# Patient Record
Sex: Female | Born: 1970 | Race: White | Hispanic: No | Marital: Married | Smoking: Never smoker
Health system: Southern US, Community
[De-identification: ages and names within clinical notes are randomized; demographics above are authoritative.]

## PROBLEM LIST (undated history)

## (undated) DIAGNOSIS — F329 Major depressive disorder, single episode, unspecified: Secondary | ICD-10-CM

## (undated) DIAGNOSIS — F419 Anxiety disorder, unspecified: Secondary | ICD-10-CM

## (undated) DIAGNOSIS — F32A Depression, unspecified: Secondary | ICD-10-CM

## (undated) DIAGNOSIS — J45909 Unspecified asthma, uncomplicated: Secondary | ICD-10-CM

## (undated) DIAGNOSIS — G4762 Sleep related leg cramps: Secondary | ICD-10-CM

## (undated) DIAGNOSIS — O2230 Deep phlebothrombosis in pregnancy, unspecified trimester: Secondary | ICD-10-CM

## (undated) DIAGNOSIS — I2699 Other pulmonary embolism without acute cor pulmonale: Secondary | ICD-10-CM

## (undated) DIAGNOSIS — R002 Palpitations: Secondary | ICD-10-CM

## (undated) HISTORY — DX: Palpitations: R00.2

## (undated) HISTORY — PX: ABDOMINAL HYSTERECTOMY: SHX81

## (undated) HISTORY — DX: Sleep related leg cramps: G47.62

## (undated) HISTORY — PX: TUBAL LIGATION: SHX77

---
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
## 1998-06-26 ENCOUNTER — Other Ambulatory Visit: Admission: RE | Admit: 1998-06-26 | Discharge: 1998-06-26 | Payer: Self-pay | Admitting: *Deleted

## 1999-01-13 ENCOUNTER — Ambulatory Visit (HOSPITAL_COMMUNITY): Admission: RE | Admit: 1999-01-13 | Discharge: 1999-01-13 | Payer: Self-pay | Admitting: Gastroenterology

## 2000-01-13 ENCOUNTER — Other Ambulatory Visit: Admission: RE | Admit: 2000-01-13 | Discharge: 2000-01-13 | Payer: Self-pay | Admitting: *Deleted

## 2000-06-07 ENCOUNTER — Ambulatory Visit (HOSPITAL_COMMUNITY): Admission: RE | Admit: 2000-06-07 | Discharge: 2000-06-07 | Payer: Self-pay | Admitting: Gastroenterology

## 2001-10-16 ENCOUNTER — Other Ambulatory Visit: Admission: RE | Admit: 2001-10-16 | Discharge: 2001-10-16 | Payer: Self-pay | Admitting: *Deleted

## 2002-02-21 ENCOUNTER — Other Ambulatory Visit: Admission: RE | Admit: 2002-02-21 | Discharge: 2002-02-21 | Payer: Self-pay | Admitting: *Deleted

## 2003-05-27 ENCOUNTER — Other Ambulatory Visit: Admission: RE | Admit: 2003-05-27 | Discharge: 2003-05-27 | Payer: Self-pay | Admitting: *Deleted

## 2005-03-03 ENCOUNTER — Other Ambulatory Visit: Admission: RE | Admit: 2005-03-03 | Discharge: 2005-03-03 | Payer: Self-pay | Admitting: Obstetrics and Gynecology

## 2005-04-30 ENCOUNTER — Ambulatory Visit: Payer: Self-pay | Admitting: Internal Medicine

## 2005-07-16 ENCOUNTER — Ambulatory Visit: Payer: Self-pay | Admitting: Internal Medicine

## 2005-07-23 ENCOUNTER — Ambulatory Visit: Payer: Self-pay | Admitting: Internal Medicine

## 2005-07-26 ENCOUNTER — Ambulatory Visit: Payer: Self-pay | Admitting: Internal Medicine

## 2005-07-30 ENCOUNTER — Ambulatory Visit: Payer: Self-pay | Admitting: Internal Medicine

## 2005-08-05 ENCOUNTER — Ambulatory Visit: Payer: Self-pay | Admitting: Internal Medicine

## 2005-08-13 ENCOUNTER — Ambulatory Visit: Payer: Self-pay | Admitting: Internal Medicine

## 2005-08-27 ENCOUNTER — Ambulatory Visit: Payer: Self-pay | Admitting: Internal Medicine

## 2005-09-06 ENCOUNTER — Ambulatory Visit: Payer: Self-pay | Admitting: Internal Medicine

## 2005-09-13 ENCOUNTER — Ambulatory Visit: Payer: Self-pay | Admitting: Internal Medicine

## 2005-09-28 ENCOUNTER — Ambulatory Visit: Payer: Self-pay | Admitting: Internal Medicine

## 2005-10-26 ENCOUNTER — Ambulatory Visit: Payer: Self-pay | Admitting: Internal Medicine

## 2005-11-29 ENCOUNTER — Ambulatory Visit: Payer: Self-pay | Admitting: Internal Medicine

## 2006-05-03 DIAGNOSIS — I2699 Other pulmonary embolism without acute cor pulmonale: Secondary | ICD-10-CM

## 2006-05-03 HISTORY — DX: Other pulmonary embolism without acute cor pulmonale: I26.99

## 2006-05-11 DIAGNOSIS — I2699 Other pulmonary embolism without acute cor pulmonale: Secondary | ICD-10-CM

## 2006-09-30 ENCOUNTER — Ambulatory Visit (HOSPITAL_COMMUNITY): Admission: RE | Admit: 2006-09-30 | Discharge: 2006-09-30 | Payer: Self-pay | Admitting: Obstetrics and Gynecology

## 2006-09-30 ENCOUNTER — Encounter (HOSPITAL_COMMUNITY): Payer: Self-pay | Admitting: Obstetrics and Gynecology

## 2007-06-06 ENCOUNTER — Telehealth (INDEPENDENT_AMBULATORY_CARE_PROVIDER_SITE_OTHER): Payer: Self-pay | Admitting: *Deleted

## 2008-04-19 ENCOUNTER — Ambulatory Visit: Payer: Self-pay | Admitting: Family Medicine

## 2008-04-19 DIAGNOSIS — Z86718 Personal history of other venous thrombosis and embolism: Secondary | ICD-10-CM | POA: Insufficient documentation

## 2008-04-19 DIAGNOSIS — R42 Dizziness and giddiness: Secondary | ICD-10-CM

## 2008-04-19 DIAGNOSIS — R252 Cramp and spasm: Secondary | ICD-10-CM | POA: Insufficient documentation

## 2008-04-22 ENCOUNTER — Encounter (INDEPENDENT_AMBULATORY_CARE_PROVIDER_SITE_OTHER): Payer: Self-pay | Admitting: *Deleted

## 2008-04-22 LAB — CONVERTED CEMR LAB
BUN: 12 mg/dL (ref 6–23)
Basophils Absolute: 0 10*3/uL (ref 0.0–0.1)
CO2: 26 meq/L (ref 19–32)
Calcium: 9.4 mg/dL (ref 8.4–10.5)
Chloride: 106 meq/L (ref 96–112)
Creatinine, Ser: 0.5 mg/dL (ref 0.4–1.2)
GFR calc Af Amer: 177 mL/min
GFR calc non Af Amer: 146 mL/min
Glucose, Bld: 74 mg/dL (ref 70–99)
MCHC: 34.1 g/dL (ref 30.0–36.0)
MCV: 89.6 fL (ref 78.0–100.0)
Neutrophils Relative %: 50.3 % (ref 43.0–77.0)
Platelets: 237 10*3/uL (ref 150–400)
Potassium: 3.9 meq/L (ref 3.5–5.1)
RDW: 12.2 % (ref 11.5–14.6)
TSH: 1.68 microintl units/mL (ref 0.35–5.50)

## 2008-05-17 ENCOUNTER — Encounter: Payer: Self-pay | Admitting: Family Medicine

## 2008-05-31 ENCOUNTER — Encounter: Payer: Self-pay | Admitting: Family Medicine

## 2008-11-21 ENCOUNTER — Ambulatory Visit: Payer: Self-pay | Admitting: Internal Medicine

## 2008-11-21 DIAGNOSIS — T148XXA Other injury of unspecified body region, initial encounter: Secondary | ICD-10-CM | POA: Insufficient documentation

## 2009-01-03 ENCOUNTER — Encounter: Admission: RE | Admit: 2009-01-03 | Discharge: 2009-01-03 | Payer: Self-pay | Admitting: Obstetrics and Gynecology

## 2010-05-24 ENCOUNTER — Encounter: Payer: Self-pay | Admitting: Internal Medicine

## 2010-09-07 ENCOUNTER — Other Ambulatory Visit: Payer: Self-pay | Admitting: Surgery

## 2010-09-18 NOTE — H&P (Signed)
NAMEBROOKLEN, Diane Shaffer            ACCOUNT NO.:  [id number]   MEDICAL RECORD NO.:  [id number]          PATIENT TYPE:  AMB   LOCATION:  SDC                           FACILITY:  [facility]   PHYSICIAN:  Zelphia Cairo, MD    DATE OF BIRTH:  1971/02/10   DATE OF ADMISSION:  DATE OF DISCHARGE:                              HISTORY & PHYSICAL   HISTORY OF PRESENT ILLNESS:  The patient is a 40 year old white female  who presented in December with complaints of heavy menstrual cycles.  Her past medical history is significant for a right-sided pulmonary  embolus diagnosed in March.  Therefore, she is unable to be on  combination contraceptive pills.  Since being off birth control pills,  she has noticed heavier, longer menstrual cycles.   PAST MEDICAL HISTORY:  1. Migraines.  2. Pulmonary embolus.   PAST SURGICAL HISTORY:  Negative.   SOCIAL HISTORY:  Negative for tobacco use.   FAMILY HISTORY:  Significant for a sister with anemia and a mother with  asthma.   ALLERGIES:  None.   MEDICATIONS:  None.   GYNECOLOGIC HISTORY:  Significant for abnormal Pap smears in the past.  She is status post a LEEP in July 2003.  Pap smears have since been  normal.   PHYSICAL EXAMINATION:  VITAL SIGNS:  Height 5 feet, 10 inches, weight  172, blood pressure 116/76, hemoglobin 15, urine negative.  HEAD/NECK:  Normal.  No thyromegaly or nodularity.  HEART:  Regular rate and rhythm.  LUNGS:  Clear bilaterally.  ABDOMEN:  Soft, nontender and nondistended.  No masses palpated.  PELVIC:  Normal external female genitalia and urethral meatus.  Vagina  and cervix are normal and without lesions.  Uterus is mobile and  nontender.  No adnexal masses noted.   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  There were no adnexal masses or free fluid noted.  After  saline infusion, a 13-mm polypoid mass was seen within the  endometrial  cavity at the fundus.   ASSESSMENT/PLAN:  A 40 year old white female with endometrial polyp and  heavy long menstrual cycles.  We discussed options, including  hysteroscopy D&C with resection of polyp.  The patient agrees.  Informed  consent was obtained.      Zelphia Cairo, MD  Electronically Signed     GA/MEDQ  D:  09/29/2006  T:  09/29/2006  Job:  [id number]

## 2010-09-18 NOTE — Op Note (Signed)
Diane Shaffer, LODWICK            ACCOUNT NO.:  [id number]   MEDICAL RECORD NO.:  [id number]          PATIENT TYPE:  AMB   LOCATION:  SDC                           [facility]   PHYSICIAN:  Zelphia Cairo, MD    DATE OF BIRTH:  1970/06/05   DATE OF PROCEDURE:  09/30/2006  DATE OF DISCHARGE:                               OPERATIVE REPORT   PREOPERATIVE DIAGNOSES:  1. Heavy menses.  2. Suspect endometrial polyp.   POSTOPERATIVE DIAGNOSIS:  1. Heavy menses.  2. Suspect endometrial polyp.   PROCEDURE:  Hysteroscopy, dilatation and curettage.   SURGEON:  Zelphia Cairo, MD   ESTIMATED BLOOD LOSS:  None.   ANESTHESIA:  General.   FLUID DEFICIT:  Zero.   COMPLICATIONS:  None.   SPECIMEN:  Endometrial curettings.   PROCEDURE:  The patient was taken to the operating room, where pneumatic  compression device hose were placed on bilateral lower extremities and  anesthesia was adequately obtained.  She was placed in the dorsal  lithotomy position using Allen stirrups.  She was prepped and draped in  sterile fashion and an in-and-out catheter was used to drain her bladder  for approximately 150 mL of clear urine.  A bivalve speculum was placed  in the vagina and a single-tooth tenaculum was used to grasp the  anterior lip of the cervix.  Lidocaine 1% was used to provide a cervical  block.  The cervix was patulous and easily dilated.  The hysteroscope  was then inserted and the endometrial cavity was inspected.  Bilateral  ostia were visualized and appeared normal.  There was some small fluffy  polypoid and endometrial-type tissue noted in the posterior uterus near  the internal os.  Otherwise the  endometrial cavity appeared normal.  The hysteroscope was removed and an  endometrial curetting was performed.  The specimen was placed on Telfa  and sent off to pathology.  The speculum and tenaculum were removed from  the vagina and the patient was taken to the recovery room in  stable  condition.      Zelphia Cairo, MD  Electronically Signed     GA/MEDQ  D:  09/30/2006  T:  09/30/2006  Job:  [id number]

## 2011-11-30 ENCOUNTER — Ambulatory Visit (INDEPENDENT_AMBULATORY_CARE_PROVIDER_SITE_OTHER): Payer: BC Managed Care – PPO | Admitting: Family Medicine

## 2011-11-30 ENCOUNTER — Encounter: Payer: Self-pay | Admitting: Family Medicine

## 2011-11-30 VITALS — BP 110/74 | HR 68 | Temp 98.5°F | Ht 68.5 in | Wt 183.8 lb

## 2011-11-30 DIAGNOSIS — R259 Unspecified abnormal involuntary movements: Secondary | ICD-10-CM

## 2011-11-30 DIAGNOSIS — Z0000 Encounter for general adult medical examination without abnormal findings: Secondary | ICD-10-CM

## 2011-11-30 DIAGNOSIS — R251 Tremor, unspecified: Secondary | ICD-10-CM

## 2011-11-30 DIAGNOSIS — R6889 Other general symptoms and signs: Secondary | ICD-10-CM

## 2011-11-30 LAB — BASIC METABOLIC PANEL
BUN: 12 mg/dL (ref 6–23)
Calcium: 9 mg/dL (ref 8.4–10.5)
Creatinine, Ser: 0.9 mg/dL (ref 0.4–1.2)
GFR: 75.38 mL/min (ref >60.00)
Glucose, Bld: 90 mg/dL (ref 70–99)

## 2011-11-30 LAB — HEPATIC FUNCTION PANEL
ALT: 16 U/L (ref 0–35)
AST: 19 U/L (ref 0–37)
Albumin: 3.9 g/dL (ref 3.5–5.2)
Alkaline Phosphatase: 41 U/L (ref 39–117)
Bilirubin, Direct: 0.1 mg/dL (ref 0.0–0.3)
Total Bilirubin: 0.6 mg/dL (ref 0.3–1.2)
Total Protein: 7.3 g/dL (ref 6.0–8.3)

## 2011-11-30 LAB — CBC WITH DIFFERENTIAL/PLATELET
Basophils Relative: 0.6 % (ref 0.0–3.0)
Eosinophils Relative: 3.9 % (ref 0.0–5.0)
HCT: 39.6 % (ref 36.0–46.0)
Lymphs Abs: 1.8 10*3/uL (ref 0.7–4.0)
MCV: 92.1 fl (ref 78.0–100.0)
Monocytes Absolute: 0.5 10*3/uL (ref 0.1–1.0)
Neutro Abs: 3.3 10*3/uL (ref 1.4–7.7)
Platelets: 246 10*3/uL (ref 150.0–400.0)
RBC: 4.3 Mil/uL (ref 3.87–5.11)
WBC: 6 10*3/uL (ref 4.5–10.5)

## 2011-11-30 LAB — LIPID PANEL
HDL: 57 mg/dL (ref >39.00)
Triglycerides: 78 mg/dL (ref 0.0–149.0)

## 2011-11-30 LAB — TSH: TSH: 0.83 u[IU]/mL (ref 0.35–5.50)

## 2011-11-30 NOTE — Assessment & Plan Note (Signed)
Pt's PE WNL.  UTD on GYN.  Check labs.  EKG done- see document for interpretation.  Anticipatory guidance provided.  

## 2011-11-30 NOTE — Patient Instructions (Addendum)
Follow up in 4-6 weeks to recheck the tremor Keep a log of your symptoms- when they occur, how long they last, any relation to stress/food/etc, what makes it better/worse, etc We'll notify you of your lab results Call with any questions or concerns Hang in there!

## 2011-11-30 NOTE — Progress Notes (Signed)
  Subjective:    Patient ID: Diane Shaffer, female    DOB: Aug 11, 1970, 41 y.o.   MRN: [id number]  HPI Pt desires CPE.  Concerns- bruising 'all the time', cold intolerance, and 'random shaking' of hands.  Occasionally will have shaking of legs.  sxs started w/in the last year.  At times will have associated weakness of arm.  No relation to eating.  OB-GYN felt sxs may be stress related and started her on Lexapro 1 week ago.  Shaking will resolve spontaneously.  Not able to comment on duration.   Review of Systems Patient reports no vision/ hearing changes, adenopathy,fever, weight change,  persistant/recurrent hoarseness , swallowing issues, chest pain, palpitations, edema, persistant/recurrent cough, hemoptysis, dyspnea (rest/exertional/paroxysmal nocturnal), gastrointestinal bleeding (melena, rectal bleeding), abdominal pain, significant heartburn, bowel changes, GU symptoms (dysuria, hematuria, incontinence), Gyn symptoms (abnormal  bleeding, pain),  syncope, memory loss, numbness & tingling, skin/hair/nail changes..     Objective:   Physical Exam General Appearance:    Alert, cooperative, no distress, appears stated age  Head:    Normocephalic, without obvious abnormality, atraumatic  Eyes:    PERRL, conjunctiva/corneas clear, EOM's intact, fundi    benign, both eyes  Ears:    Normal TM's and external ear canals, both ears  Nose:   Nares normal, septum midline, mucosa normal, no drainage    or sinus tenderness  Throat:   Lips, mucosa, and tongue normal; teeth and gums normal  Neck:   Supple, symmetrical, trachea midline, no adenopathy;    Thyroid: no enlargement/tenderness/nodules  Back:     Symmetric, no curvature, ROM normal, no CVA tenderness  Lungs:     Clear to auscultation bilaterally, respirations unlabored  Chest Wall:    No tenderness or deformity   Heart:    Regular rate and rhythm, S1 and S2 normal, no murmur, rub   or gallop  Breast Exam:    Deferred to GYN  Abdomen:      Soft, non-tender, bowel sounds active all four quadrants,    no masses, no organomegaly  Genitalia:    Deferred to GYN  Rectal:    Extremities:   Extremities normal, atraumatic, no cyanosis or edema  Pulses:   2+ and symmetric all extremities  Skin:   Skin color, texture, turgor normal, no rashes or lesions  Lymph nodes:   Cervical, supraclavicular, and axillary nodes normal  Neurologic:   CNII-XII intact, normal strength, sensation and reflexes    Throughout.  No tremor noted.  No cogwheeling.          Assessment & Plan:

## 2011-11-30 NOTE — Assessment & Plan Note (Signed)
New.  No tremor seen today.  May be anxiety related.  Pt recently started on Lexapro.  Encouraged her to chart sxs.  Check labs to determine possible metabolic cause.  Reviewed supportive care and red flags that should prompt return.  Pt expressed understanding and is in agreement w/ plan.

## 2011-11-30 NOTE — Assessment & Plan Note (Signed)
New.  Check CBC to r/o anemia, TSH to r/o hypothyroid.

## 2011-12-01 ENCOUNTER — Encounter: Payer: Self-pay | Admitting: *Deleted

## 2011-12-02 LAB — VITAMIN D 1,25 DIHYDROXY
Vitamin D 1, 25 (OH)2 Total: 73 pg/mL — ABNORMAL HIGH (ref 18–72)
Vitamin D2 1, 25 (OH)2: <8 pg/mL

## 2011-12-03 ENCOUNTER — Encounter: Payer: Self-pay | Admitting: *Deleted

## 2011-12-30 ENCOUNTER — Encounter: Payer: Self-pay | Admitting: Family Medicine

## 2012-03-02 ENCOUNTER — Encounter (HOSPITAL_COMMUNITY): Payer: Self-pay | Admitting: Pharmacist

## 2012-03-06 ENCOUNTER — Encounter (HOSPITAL_COMMUNITY)
Admission: RE | Admit: 2012-03-06 | Discharge: 2012-03-06 | Disposition: A | Discharge status: Home / Self Care | Payer: BC Managed Care – PPO | Source: Ambulatory Visit | Attending: Obstetrics and Gynecology | Admitting: Obstetrics and Gynecology

## 2012-03-06 ENCOUNTER — Encounter (HOSPITAL_COMMUNITY): Payer: Self-pay

## 2012-03-06 HISTORY — DX: Other pulmonary embolism without acute cor pulmonale: I26.99

## 2012-03-06 HISTORY — DX: Depression, unspecified: F32.A

## 2012-03-06 HISTORY — DX: Major depressive disorder, single episode, unspecified: F32.9

## 2012-03-06 HISTORY — DX: Anxiety disorder, unspecified: F41.9

## 2012-03-06 LAB — CBC
Hemoglobin: 13.6 g/dL (ref 12.0–15.0)
MCH: 31.1 pg (ref 26.0–34.0)
Platelets: 213 10*3/uL (ref 150–400)
RBC: 4.37 MIL/uL (ref 3.87–5.11)
WBC: 10.4 10*3/uL (ref 4.0–10.5)

## 2012-03-06 LAB — SURGICAL PCR SCREEN: MRSA, PCR: NEGATIVE

## 2012-03-06 NOTE — Patient Instructions (Addendum)
20 Diane Shaffer  03/06/2012   Your procedure is scheduled on:  03/16/12  Enter through the Main Entrance of [facility] at 6 AM.  Pick up the phone at the desk and dial [phone].   Call this number if you have problems the morning of surgery: [phone]   Remember:   Do not eat food:After Midnight.  Do not drink clear liquids: After Midnight.  Take these medicines the morning of surgery with A SIP OF WATER: NA   Do not wear jewelry, make-up or nail polish.  Do not wear lotions, powders, or perfumes. You may wear deodorant.  Do not shave 48 hours prior to surgery.  Do not bring valuables to the hospital.  Contacts, dentures or bridgework may not be worn into surgery.  Leave suitcase in the car. After surgery it may be brought to your room.  For patients admitted to the hospital, checkout time is 11:00 AM the day of discharge.   Patients discharged the day of surgery will not be allowed to drive home.  Name and phone number of your driver: NA  Special Instructions: Shower using CHG 2 nights before surgery and the night before surgery.  If you shower the day of surgery use CHG.  Use special wash - you have one bottle of CHG for all showers.  You should use approximately 1/3 of the bottle for each shower.   Please read over the following fact sheets that you were given: MRSA Information

## 2012-03-07 NOTE — H&P (Signed)
[name], [name] NO.:  [id number]  MEDICAL RECORD NO.:  [id number]  LOCATION:  SDC                           FACILITY:  [facility]  PHYSICIAN:  Zelphia Cairo, MD    DATE OF BIRTH:  [birth year]  DATE OF ADMISSION:  03/06/2012 DATE OF DISCHARGE:  03/06/2012                             HISTORY & PHYSICAL   HISTORY OF PRESENT ILLNESS:  A 41 year old, G2, P2, presents today for surgical management of menorrhagia.  She has failed medical management and presents today for definitive treatment.  PAST MEDICAL HISTORY: 1. History of pulmonary embolus. 2. History of asthma. 3. Depression and anxiety.  MEDICATIONS: 1. Lexapro. 2. Depo-Provera.  SURGICAL HISTORY:  D and C, and tubal ligation.  SOCIAL HISTORY:  Negative for tobacco, positive for alcohol 1 drink per day.  ALLERGIES:  MORPHINE (has tolerated Percocet in the past).  FAMILY HISTORY:  Heart disease, thyroid disease, respiratory disease, arthritis, and diabetes.  PHYSICAL EXAMINATION:  VITAL SIGNS:  Height 5 feet and 8 inches, weight 190, blood pressure 110/70. GENERA:  She is in no acute distress. HEAD AND NECK:  Normal.  No thyromegaly. HEART:  Regular rate and rhythm. LUNGS:  Clear bilaterally. ABDOMEN:  Soft, nontender, nondistended. PELVIC:  Shows normal external female genitalia, and urethral meatus, vagina and cervix are normal.  No lesions.  Uterus is mobile and nontender.  No adnexal masses.  ASSESSMENT:  Menorrhagia.  PLAN:  Laparoscopic-assisted vaginal hysterectomy.     Zelphia Cairo, MD     GA/MEDQ  D:  03/06/2012  T:  03/07/2012  Job:  [id number]

## 2012-03-16 ENCOUNTER — Encounter (HOSPITAL_COMMUNITY)
Admission: RE | Disposition: A | Discharge status: Home / Self Care | Payer: Self-pay | Source: Ambulatory Visit | Attending: Obstetrics and Gynecology

## 2012-03-16 ENCOUNTER — Encounter (HOSPITAL_COMMUNITY): Payer: Self-pay | Admitting: *Deleted

## 2012-03-16 ENCOUNTER — Observation Stay (HOSPITAL_COMMUNITY)
Admission: RE | Admit: 2012-03-16 | Discharge: 2012-03-17 | Disposition: A | Discharge status: Home / Self Care | Payer: BC Managed Care – PPO | Source: Ambulatory Visit | Attending: Obstetrics and Gynecology | Admitting: Obstetrics and Gynecology

## 2012-03-16 ENCOUNTER — Encounter (HOSPITAL_COMMUNITY): Payer: Self-pay | Admitting: Anesthesiology

## 2012-03-16 ENCOUNTER — Ambulatory Visit (HOSPITAL_COMMUNITY): Payer: BC Managed Care – PPO | Admitting: Anesthesiology

## 2012-03-16 DIAGNOSIS — N946 Dysmenorrhea, unspecified: Secondary | ICD-10-CM | POA: Insufficient documentation

## 2012-03-16 DIAGNOSIS — N920 Excessive and frequent menstruation with regular cycle: Principal | ICD-10-CM | POA: Insufficient documentation

## 2012-03-16 HISTORY — PX: LAPAROSCOPIC ASSISTED VAGINAL HYSTERECTOMY: SHX5398

## 2012-03-16 LAB — TYPE AND SCREEN: ABO/RH(D): B POS

## 2012-03-16 SURGERY — HYSTERECTOMY, VAGINAL, LAPAROSCOPY-ASSISTED
Anesthesia: General | Site: Abdomen | Wound class: Clean Contaminated

## 2012-03-16 MED ORDER — ONDANSETRON HCL 4 MG/2ML IJ SOLN: 4.0000 mg | Freq: Four times a day (QID) | INTRAMUSCULAR | Status: DC | PRN

## 2012-03-16 MED ORDER — MENTHOL 3 MG MT LOZG: 1.0000 | LOZENGE | OROMUCOSAL | Status: DC | PRN

## 2012-03-16 MED ORDER — PROMETHAZINE HCL 25 MG/ML IJ SOLN: 6.2500 mg | INTRAMUSCULAR | Status: DC | PRN

## 2012-03-16 MED ORDER — ONDANSETRON HCL 4 MG/2ML IJ SOLN
INTRAMUSCULAR | Status: AC
  Filled 2012-03-16: qty 2

## 2012-03-16 MED ORDER — ONDANSETRON HCL 4 MG PO TABS: 4.0000 mg | ORAL_TABLET | Freq: Four times a day (QID) | ORAL | Status: DC | PRN

## 2012-03-16 MED ORDER — OXYCODONE-ACETAMINOPHEN 5-325 MG PO TABS
1.0000 | ORAL_TABLET | ORAL | Status: DC | PRN
Start: 2012-03-16 — End: 2012-03-17
  Administered 2012-03-16: 2 via ORAL
  Filled 2012-03-16: qty 2

## 2012-03-16 MED ORDER — HYDROMORPHONE HCL PF 1 MG/ML IJ SOLN
1.0000 mg | INTRAMUSCULAR | Status: DC | PRN
  Administered 2012-03-16 – 2012-03-17 (×5): 1 mg via INTRAVENOUS
  Filled 2012-03-16 (×5): qty 1

## 2012-03-16 MED ORDER — LACTATED RINGERS IV SOLN
INTRAVENOUS | Status: DC
  Administered 2012-03-16 (×2): via INTRAVENOUS
  Administered 2012-03-16: 125 mL/h via INTRAVENOUS

## 2012-03-16 MED ORDER — FENTANYL CITRATE 0.05 MG/ML IJ SOLN
25.0000 ug | INTRAMUSCULAR | Status: DC | PRN
  Administered 2012-03-16: 50 ug via INTRAVENOUS

## 2012-03-16 MED ORDER — HYDROMORPHONE HCL PF 1 MG/ML IJ SOLN
INTRAMUSCULAR | Status: DC | PRN
  Administered 2012-03-16: 1 mg via INTRAVENOUS

## 2012-03-16 MED ORDER — BUPIVACAINE HCL (PF) 0.25 % IJ SOLN
INTRAMUSCULAR | Status: AC
  Filled 2012-03-16: qty 30

## 2012-03-16 MED ORDER — EPHEDRINE SULFATE 50 MG/ML IJ SOLN
INTRAMUSCULAR | Status: DC | PRN
  Administered 2012-03-16: 10 mg via INTRAVENOUS

## 2012-03-16 MED ORDER — LIDOCAINE HCL (CARDIAC) 20 MG/ML IV SOLN
INTRAVENOUS | Status: DC | PRN
  Administered 2012-03-16: 50 mg via INTRAVENOUS

## 2012-03-16 MED ORDER — MIDAZOLAM HCL 5 MG/5ML IJ SOLN
INTRAMUSCULAR | Status: DC | PRN
  Administered 2012-03-16: 2 mg via INTRAVENOUS

## 2012-03-16 MED ORDER — HYDROMORPHONE HCL PF 1 MG/ML IJ SOLN
INTRAMUSCULAR | Status: AC
  Filled 2012-03-16: qty 1

## 2012-03-16 MED ORDER — ROCURONIUM BROMIDE 100 MG/10ML IV SOLN
INTRAVENOUS | Status: DC | PRN
  Administered 2012-03-16: 50 mg via INTRAVENOUS

## 2012-03-16 MED ORDER — ONDANSETRON HCL 4 MG/2ML IJ SOLN
INTRAMUSCULAR | Status: DC | PRN
  Administered 2012-03-16: 4 mg via INTRAVENOUS

## 2012-03-16 MED ORDER — PROPOFOL 10 MG/ML IV EMUL
INTRAVENOUS | Status: DC | PRN
  Administered 2012-03-16: 200 mg via INTRAVENOUS

## 2012-03-16 MED ORDER — PROPOFOL 10 MG/ML IV EMUL
INTRAVENOUS | Status: AC
  Filled 2012-03-16: qty 20

## 2012-03-16 MED ORDER — DEXTROSE 5 % IV SOLN
2.0000 g | INTRAVENOUS | Status: AC
  Administered 2012-03-16: 2 g via INTRAVENOUS
  Filled 2012-03-16: qty 2

## 2012-03-16 MED ORDER — IBUPROFEN 600 MG PO TABS
600.0000 mg | ORAL_TABLET | Freq: Four times a day (QID) | ORAL | Status: DC | PRN
  Administered 2012-03-17 (×2): 600 mg via ORAL
  Filled 2012-03-16 (×2): qty 1

## 2012-03-16 MED ORDER — NEOSTIGMINE METHYLSULFATE 1 MG/ML IJ SOLN
INTRAMUSCULAR | Status: DC | PRN
  Administered 2012-03-16: 2 mg via INTRAVENOUS

## 2012-03-16 MED ORDER — MEPERIDINE HCL 25 MG/ML IJ SOLN: 6.2500 mg | INTRAMUSCULAR | Status: DC | PRN

## 2012-03-16 MED ORDER — FENTANYL CITRATE 0.05 MG/ML IJ SOLN
INTRAMUSCULAR | Status: AC
  Filled 2012-03-16: qty 2

## 2012-03-16 MED ORDER — KETOROLAC TROMETHAMINE 30 MG/ML IJ SOLN: 15.0000 mg | Freq: Once | INTRAMUSCULAR | Status: DC | PRN

## 2012-03-16 MED ORDER — FENTANYL CITRATE 0.05 MG/ML IJ SOLN
INTRAMUSCULAR | Status: AC
  Filled 2012-03-16: qty 5

## 2012-03-16 MED ORDER — DEXTROSE IN LACTATED RINGERS 5 % IV SOLN
INTRAVENOUS | Status: DC
  Administered 2012-03-16 (×2): via INTRAVENOUS

## 2012-03-16 MED ORDER — ROCURONIUM BROMIDE 50 MG/5ML IV SOLN
INTRAVENOUS | Status: AC
  Filled 2012-03-16: qty 1

## 2012-03-16 MED ORDER — FENTANYL CITRATE 0.05 MG/ML IJ SOLN
INTRAMUSCULAR | Status: DC | PRN
  Administered 2012-03-16: 100 ug via INTRAVENOUS

## 2012-03-16 MED ORDER — BUPIVACAINE HCL (PF) 0.25 % IJ SOLN
INTRAMUSCULAR | Status: DC | PRN
  Administered 2012-03-16: 10 mL

## 2012-03-16 MED ORDER — INFLUENZA VIRUS VACC SPLIT PF IM SUSP
0.5000 mL | INTRAMUSCULAR | Status: AC
  Administered 2012-03-17: 0.5 mL via INTRAMUSCULAR

## 2012-03-16 MED ORDER — MIDAZOLAM HCL 2 MG/2ML IJ SOLN: 0.5000 mg | Freq: Once | INTRAMUSCULAR | Status: DC | PRN

## 2012-03-16 MED ORDER — MIDAZOLAM HCL 2 MG/2ML IJ SOLN
INTRAMUSCULAR | Status: AC
  Filled 2012-03-16: qty 2

## 2012-03-16 MED ORDER — ESCITALOPRAM OXALATE 10 MG PO TABS
10.0000 mg | ORAL_TABLET | Freq: Every day | ORAL | Status: DC
  Administered 2012-03-17: 10 mg via ORAL
  Filled 2012-03-16 (×3): qty 1

## 2012-03-16 MED ORDER — GLYCOPYRROLATE 0.2 MG/ML IJ SOLN
INTRAMUSCULAR | Status: DC | PRN
  Administered 2012-03-16: 0.2 mg via INTRAVENOUS
  Administered 2012-03-16: 0.4 mg via INTRAVENOUS

## 2012-03-16 MED ORDER — LIDOCAINE HCL (CARDIAC) 20 MG/ML IV SOLN
INTRAVENOUS | Status: AC
  Filled 2012-03-16: qty 5

## 2012-03-16 SURGICAL SUPPLY — 39 items
ADH SKN CLS APL DERMABOND .7 (GAUZE/BANDAGES/DRESSINGS) ×1
CANISTER SUCTION 2500CC (MISCELLANEOUS) ×2 IMPLANT
CHLORAPREP W/TINT 26ML (MISCELLANEOUS) ×2 IMPLANT
CLOTH BEACON ORANGE TIMEOUT ST (SAFETY) ×2 IMPLANT
COVER TABLE BACK 60X90 (DRAPES) ×2 IMPLANT
DERMABOND ADVANCED (GAUZE/BANDAGES/DRESSINGS) ×1
DERMABOND ADVANCED .7 DNX12 (GAUZE/BANDAGES/DRESSINGS) ×1 IMPLANT
ELECT REM PT RETURN 9FT ADLT (ELECTROSURGICAL) ×2
ELECTRODE REM PT RTRN 9FT ADLT (ELECTROSURGICAL) ×1 IMPLANT
GLOVE BIO SURGEON STRL SZ 6.5 (GLOVE) ×4 IMPLANT
GLOVE BIO SURGEON STRL SZ8 (GLOVE) ×2 IMPLANT
GLOVE BIOGEL PI IND STRL 6.5 (GLOVE) ×1 IMPLANT
GLOVE BIOGEL PI IND STRL 7.0 (GLOVE) ×2 IMPLANT
GLOVE BIOGEL PI INDICATOR 6.5 (GLOVE) ×1
GLOVE BIOGEL PI INDICATOR 7.0 (GLOVE) ×2
GLOVE ECLIPSE 6.5 STRL STRAW (GLOVE) ×2 IMPLANT
GLOVE INDICATOR 6.5 STRL GRN (GLOVE) ×2 IMPLANT
GLOVE INDICATOR 7.0 STRL GRN (GLOVE) ×2 IMPLANT
GOWN PREVENTION PLUS LG XLONG (DISPOSABLE) ×6 IMPLANT
GOWN STRL REIN XL XLG (GOWN DISPOSABLE) ×4 IMPLANT
NS IRRIG 1000ML POUR BTL (IV SOLUTION) ×2 IMPLANT
PACK LAVH (CUSTOM PROCEDURE TRAY) ×2 IMPLANT
PROTECTOR NERVE ULNAR (MISCELLANEOUS) ×4 IMPLANT
SEALER TISSUE G2 CVD JAW 45CM (ENDOMECHANICALS) ×2 IMPLANT
SPONGE GAUZE 2X2 8PLY STRL LF (GAUZE/BANDAGES/DRESSINGS) ×2 IMPLANT
SUT MNCRL 0 MO-4 VIOLET 18 CR (SUTURE) ×3 IMPLANT
SUT MON AB 2-0 CT1 36 (SUTURE) ×2 IMPLANT
SUT MONOCRYL 0 MO 4 18  CR/8 (SUTURE) ×3
SUT VIC AB 3-0 PS2 18 (SUTURE) ×2
SUT VIC AB 3-0 PS2 18XBRD (SUTURE) ×1 IMPLANT
SUT VICRYL 0 TIES 12 18 (SUTURE) ×2 IMPLANT
SUT VICRYL 0 UR6 27IN ABS (SUTURE) ×2 IMPLANT
TAPE CLOTH SURG 4X10 WHT LF (GAUZE/BANDAGES/DRESSINGS) ×2 IMPLANT
TOWEL OR 17X24 6PK STRL BLUE (TOWEL DISPOSABLE) ×4 IMPLANT
TRAY FOLEY CATH 14FR (SET/KITS/TRAYS/PACK) ×2 IMPLANT
TROCAR Z-THREAD BLADED 5X100MM (TROCAR) ×2 IMPLANT
TROCAR Z-THREAD FIOS 11X100 BL (TROCAR) ×2 IMPLANT
WARMER LAPAROSCOPE (MISCELLANEOUS) ×2 IMPLANT
WATER STERILE IRR 1000ML POUR (IV SOLUTION) ×2 IMPLANT

## 2012-03-16 NOTE — Progress Notes (Signed)
Day of Surgery Procedure(s) (LRB): LAPAROSCOPIC ASSISTED VAGINAL HYSTERECTOMY (N/A)  Subjective: Patient reports nausea and incisional pain.    Objective: I have reviewed patient's vital signs, intake and output, medications and labs.  General: cooperative GI: normal findings: soft, approp. tender Extremities: no edema, SCD in place  Assessment: s/p Procedure(s) (LRB) with comments: LAPAROSCOPIC ASSISTED VAGINAL HYSTERECTOMY (N/A): stable  Plan: post op pain Will add IV dilaudid IV antiemetics prn Continue routine post op care  LOS: 0 days    Diane Shaffer 03/16/2012, 1:41 PM

## 2012-03-16 NOTE — Anesthesia Preprocedure Evaluation (Addendum)
Anesthesia Evaluation  Patient identified by MRN, date of birth, ID band Patient awake    Reviewed: Allergy & Precautions, H&P , Patient's Chart, lab work & pertinent test results, reviewed documented beta blocker date and time   Airway Mallampati: II TM Distance: >3 FB Neck ROM: full    Dental No notable dental hx.    Pulmonary  breath sounds clear to auscultation  Pulmonary exam normal       Cardiovascular Rhythm:regular Rate:Normal     Neuro/Psych PSYCHIATRIC DISORDERS    GI/Hepatic   Endo/Other    Renal/GU      Musculoskeletal   Abdominal   Peds  Hematology   Anesthesia Other Findings Anxiety     Depression        Pulmonary embolism 2008 released by MD, result of birth control      Reproductive/Obstetrics                          Anesthesia Physical Anesthesia Plan  ASA: II  Anesthesia Plan: General   Post-op Pain Management:    Induction: Intravenous  Airway Management Planned: Oral ETT  Additional Equipment:   Intra-op Plan:   Post-operative Plan:   Informed Consent: I have reviewed the patients History and Physical, chart, labs and discussed the procedure including the risks, benefits and alternatives for the proposed anesthesia with the patient or authorized representative who has indicated his/her understanding and acceptance.   Dental Advisory Given and Dental advisory given  Plan Discussed with: CRNA and Surgeon  Anesthesia Plan Comments: (  Discussed  general anesthesia, including possible nausea, instrumentation of airway, sore throat,pulmonary aspiration, etc. I asked if the were any outstanding questions, or  concerns before we proceeded. )        Anesthesia Quick Evaluation

## 2012-03-16 NOTE — Anesthesia Postprocedure Evaluation (Signed)
Anesthesia Post Note  Patient: Diane Shaffer  Procedure(s) Performed: Procedure(s) (LRB): LAPAROSCOPIC ASSISTED VAGINAL HYSTERECTOMY (N/A)  Anesthesia type: GA  Patient location: PACU  Post pain: Pain level controlled  Post assessment: Post-op Vital signs reviewed  Last Vitals:  Filed Vitals:   03/16/12 0915  BP: 126/75  Pulse: 62  Temp:   Resp: 12    Post vital signs: Reviewed  Level of consciousness: sedated  Complications: No apparent anesthesia complications

## 2012-03-16 NOTE — Transfer of Care (Signed)
Immediate Anesthesia Transfer of Care Note  Patient: Diane Shaffer  Procedure(s) Performed: Procedure(s) (LRB) with comments: LAPAROSCOPIC ASSISTED VAGINAL HYSTERECTOMY (N/A)  Patient Location: PACU  Anesthesia Type:General  Level of Consciousness: awake, alert  and oriented  Airway & Oxygen Therapy: Patient Spontanous Breathing and Patient connected to nasal cannula oxygen  Post-op Assessment: Report given to PACU RN and Post -op Vital signs reviewed and stable  Post vital signs: Reviewed and stable  Complications: No apparent anesthesia complications

## 2012-03-16 NOTE — Progress Notes (Signed)
Plan of care discussed with pt, questions answered.  Informed consent. No change in history

## 2012-03-16 NOTE — Anesthesia Postprocedure Evaluation (Signed)
  Anesthesia Post-op Note  Patient: Diane Shaffer  Procedure(s) Performed: Procedure(s) (LRB) with comments: LAPAROSCOPIC ASSISTED VAGINAL HYSTERECTOMY (N/A)  Patient Location: [facility]  Anesthesia Type:General  Level of Consciousness: awake  Airway and Oxygen Therapy: Patient Spontanous Breathing  Post-op Pain: mild  Post-op Assessment: Patient's Cardiovascular Status Stable and Respiratory Function Stable  Post-op Vital Signs: stable  Complications: No apparent anesthesia complications

## 2012-03-16 NOTE — Addendum Note (Signed)
Addendum  created 03/16/12 1837 by Renford Dills, CRNA   Modules edited:Notes Section

## 2012-03-17 ENCOUNTER — Encounter (HOSPITAL_COMMUNITY): Payer: Self-pay | Admitting: Obstetrics and Gynecology

## 2012-03-17 LAB — CBC
HCT: 33.6 % — ABNORMAL LOW (ref 36.0–46.0)
MCHC: 33 g/dL (ref 30.0–36.0)
MCV: 90.8 fL (ref 78.0–100.0)
Platelets: 196 10*3/uL (ref 150–400)
RDW: 13.3 % (ref 11.5–15.5)
WBC: 9 10*3/uL (ref 4.0–10.5)

## 2012-03-17 MED ORDER — DSS 100 MG PO CAPS: 100.0000 mg | ORAL_CAPSULE | Freq: Two times a day (BID) | ORAL | Status: DC | PRN

## 2012-03-17 MED ORDER — OXYCODONE-ACETAMINOPHEN 5-325 MG PO TABS: 2.0000 | ORAL_TABLET | ORAL | Status: DC | PRN

## 2012-03-17 MED ORDER — OXYCODONE-ACETAMINOPHEN 5-325 MG PO TABS
2.0000 | ORAL_TABLET | ORAL | Status: DC | PRN
  Administered 2012-03-17 (×2): 2 via ORAL
  Filled 2012-03-17 (×2): qty 2

## 2012-03-17 MED ORDER — DOCUSATE SODIUM 100 MG PO CAPS
100.0000 mg | ORAL_CAPSULE | Freq: Two times a day (BID) | ORAL | Status: DC
  Administered 2012-03-17: 100 mg via ORAL
  Filled 2012-03-17: qty 1

## 2012-03-17 NOTE — Progress Notes (Signed)
Pt is discharged in the care of husband. Downstairs per ambulatory . Understands all discharged instructions well. Questions asked and answered. Denies any heavy vaginal bleeding or pain. Spirits are good. Stable.

## 2012-03-17 NOTE — Progress Notes (Signed)
Pt reports pain not controlled well w/ IV dilaudid.  No n/v.  Urinating without catheter  Af, VSS UOP > 100cc/hr Gen - appears uncomfortable Abd - soft, ND + BS Inc - c/d/i Ext - no edema or erythema, SCD in place  A/P:  Plan to d/c IV Percocet + ibuprofen today Encourage ambulation Advance diet

## 2012-03-17 NOTE — Progress Notes (Signed)
UR Chart review completed.  

## 2012-03-17 NOTE — Progress Notes (Signed)
Pt reports pain controlled w/ PO percocet and motrin.  No n/v.  No flatus.  No CP/SOB.  Has been sleeping most of afternoon.  AF, VSs Gen - pt awakened, NAD Abd - soft, NT/ND Ext NT, no edema Inc - c/d/i  A/P:  Plan for d/c home Encouraged to [employer] Rx percocet/motrin

## 2012-04-05 NOTE — Discharge Summary (Signed)
Diane Shaffer, Diane Shaffer NO.:  [id number]  MEDICAL RECORD NO.:  [id number]  LOCATION:  9320                          FACILITY:  [facility]  PHYSICIAN:  Zelphia Cairo, MD    DATE OF BIRTH:  12-07-1970  DATE OF ADMISSION:  03/16/2012 DATE OF DISCHARGE:  03/17/2012                              DISCHARGE SUMMARY   PREOPERATIVE DIAGNOSIS:  Menorrhagia.  PROCEDURES:  Laparoscopic-assisted vaginal hysterectomy.  HOSPITAL STAY:  The patient was admitted to the hospital for postoperative care after laparoscopic-assisted vaginal hysterectomy. Please see op note for further details of the procedure.  She was initially given oral pain medications; however, her pain was not controlled until IV Dilaudid was added to her pain regimen.  On postoperative day #1, her pain was better controlled, and she was able to tolerate oral intake.  Her Foley catheter was discontinued, and she was able to ambulate and urinate without difficulty.  Postoperative hemoglobin was stable at 11.1, and she remained afebrile throughout her hospital stay.  On the evening of postoperative day #1, her pain was well controlled, she was ambulating and urinating without difficulty, and she was tolerating a regular diet.  She was felt to be stable for discharge.  All of her questions were answered.  Prescription for Percocet and Motrin were given.  She will follow up in the office in 2 weeks or p.r.n.     Zelphia Cairo, MD     GA/MEDQ  D:  04/04/2012  T:  04/05/2012  Job:  [id number]

## 2012-04-05 NOTE — Op Note (Signed)
NAMESATIN, BOAL            ACCOUNT NO.:  [id number]  MEDICAL RECORD NO.:  [id number]  LOCATION:  9320                          FACILITY:  [facility]  PHYSICIAN:  Zelphia Cairo, MD    DATE OF BIRTH:  13-Nov-1970  DATE OF PROCEDURE:  03/16/2012 DATE OF DISCHARGE:  03/17/2012                              OPERATIVE REPORT   PREOPERATIVE DIAGNOSES: 1. Menorrhagia. 2. Dysmenorrhea.  POSTOPERATIVE DIAGNOSES: 1. Menorrhagia. 2. Dysmenorrhea.  PROCEDURE:  Laparoscopic-assisted vaginal hysterectomy.  SURGEON:  Zelphia Cairo, MD  SPECIMEN:  Uterus with cervix.  COMPLICATIONS:  None.  CONDITION:  Stable to recovery room.  DESCRIPTION OF PROCEDURE:  The patient was taken to the operating room after informed consent was obtained.  She was given general anesthesia, placed in the dorsal lithotomy position using Allen stirrups, prepped and draped in sterile fashion, and a Foley catheter was inserted. Bivalve speculum was placed in the vagina and a single-tooth tenaculum was placed on the anterior lip of the cervix.  A Hulka clamp was placed on the cervix.  Tenaculum and speculum were removed and our attention was turned to the abdomen.  A small infraumbilical skin incision was made with a scalpel and extended bluntly to the level of the fascia.  The fascia was identified. Optical trocar was then inserted under direct visualization.  Once intraperitoneal placement was confirmed, CO2 was turned on and survey was performed.  Bilateral ovaries and fallopian tubes appeared normal. The suprapubic incision was made with a scalpel and a 5-mm trocar was inserted under direct visualization.  The uterus was manipulated to 1 side.  The EnSeal device was placed through the operative port as the laparoscope and the utero-ovarian ligament and fallopian tube were grasped, cauterized, and cut using the EnSeal device.  This incision was extended down the broad ligament to the level of the round  ligament. The round ligament was grasped, cauterized, and cut using the EnSeal device.  Excellent hemostasis was assured and the procedure was performed on the opposite side.  Once hemostasis was noted bilaterally, CO2 was turned off.  Instruments were removed from the trocars and our attention was turned to the vagina.  The Hulka clamp was removed.  A weighted speculum was placed in the posterior vagina and a Deaver placed anteriorly.  The cervix was grasped with a toothed tenaculum, and a circumferential incision was made using the Bovie.  The posterior cul- de-sac was then entered sharply using curved Mayo scissors and a long weighted speculum was placed in the posterior cul-de-sac.  Bilateral uterosacral ligaments were grasped with curved Heaney clamps, cut and suture ligated.  Hemostasis was assured bilaterally.  Bilateral cardinal ligaments were then grasped with curved Heaney, cut and suture ligated. The uterine arteries and broad ligament were then serially clamped with Heaney clamps, transected, and suture ligated bilaterally.  Excellent hemostasis was noted.  Bilateral cornua were then clamped, transected, and the uterus was delivered.  These pedicles were then suture ligated with excellent hemostasis.  The posterior vaginal cuff was then closed in a running locked fashion, and the remainder of the vaginal cuff was closed using figure-of-eight stitches.  All instruments were then removed from the vagina and our attention  was returned to the abdomen.  The abdomen was insufflated again with CO2.  All pedicles and the vaginal cuff were inspected.  Excellent hemostasis was noted.  All instruments and trocars were then removed from the abdomen.  A deep stitch was placed in the infraumbilical incision and both skin incisions were closed with a subcuticular Vicryl stitch.  Dermabond was placed over both incisions.  Sponge, lap, needle, and instrument counts were correct x2.  She was  extubated and taken to the recovery room in stable condition.     Zelphia Cairo, MD     GA/MEDQ  D:  04/04/2012  T:  04/05/2012  Job:  [id number]

## 2013-05-10 LAB — HM MAMMOGRAPHY: HM Mammogram: NORMAL

## 2013-05-10 LAB — HM PAP SMEAR: HM Pap smear: NORMAL

## 2014-05-10 ENCOUNTER — Encounter: Payer: Self-pay | Admitting: Family Medicine

## 2014-05-10 ENCOUNTER — Ambulatory Visit (INDEPENDENT_AMBULATORY_CARE_PROVIDER_SITE_OTHER): Payer: BLUE CROSS/BLUE SHIELD | Admitting: Family Medicine

## 2014-05-10 VITALS — BP 116/84 | HR 63 | Temp 98.1°F | Resp 16 | Wt 216.0 lb

## 2014-05-10 DIAGNOSIS — R202 Paresthesia of skin: Secondary | ICD-10-CM

## 2014-05-10 DIAGNOSIS — R200 Anesthesia of skin: Secondary | ICD-10-CM | POA: Insufficient documentation

## 2014-05-10 LAB — BASIC METABOLIC PANEL
BUN: 10 mg/dL (ref 6–23)
CALCIUM: 9.5 mg/dL (ref 8.4–10.5)
CO2: 24 mEq/L (ref 19–32)
CREATININE: 0.79 mg/dL (ref 0.50–1.10)
Chloride: 104 mEq/L (ref 96–112)
Glucose, Bld: 80 mg/dL (ref 70–99)
POTASSIUM: 4.4 meq/L (ref 3.5–5.3)
Sodium: 137 mEq/L (ref 135–145)

## 2014-05-10 NOTE — Progress Notes (Signed)
Pre visit review using our clinic review tool, if applicable. No additional management support is needed unless otherwise documented below in the visit note. 

## 2014-05-10 NOTE — Patient Instructions (Signed)
Schedule your complete physical at your convenience We'll notify you of your lab results and make any changes if needed Try not to lean on your elbows and keep legs uncrossed at ankles Mobic once daily for inflammation Ice as needed We'll call you with your Neuro appt Call with any questions or concerns Happy New Year!!

## 2014-05-10 NOTE — Progress Notes (Signed)
   Subjective:    Patient ID: Diane Shaffer, female    DOB: 02/08/1971, 44 y.o.   MRN: [id number]  HPI Numbness/tingling- having sxs in both  L arm and legs bilaterally.  Has hx of this but was severe on NYE's.  Did not improve w/ standing as it usually does.  Worse in the evening, worse when sitting, better when lying.  Doesn't bother pt while sleeping.  Saw Neuro years ago (Dr Diane Shaffer) and had nerve conduction study.  Pain will start at L elbow and run up forearm 'like someone is grabbing the nerve'.  Pain will start in the bottom of the feet and run upward rarely going above the knee.   Review of Systems For ROS see HPI     Objective:   Physical Exam  Constitutional: She is oriented to person, place, and time. She appears well-developed and well-nourished. No distress.  HENT:  Head: Normocephalic and atraumatic.  Eyes: Conjunctivae and EOM are normal. Pupils are equal, round, and reactive to light.  Neck: Normal range of motion. Neck supple. No thyromegaly present.  Cardiovascular: Normal rate, regular rhythm, normal heart sounds and intact distal pulses.   Pulmonary/Chest: Effort normal and breath sounds normal. No respiratory distress. She has no wheezes. She has no rales.  Musculoskeletal: She exhibits no edema or tenderness.  Lymphadenopathy:    She has no cervical adenopathy.  Neurological: She is alert and oriented to person, place, and time. She has normal reflexes. No cranial nerve deficit. Coordination normal.  Skin: Skin is warm and dry. No erythema.  Psychiatric: She has a normal mood and affect. Her behavior is normal. Thought content normal.  Vitals reviewed.         Assessment & Plan:

## 2014-05-11 LAB — CBC WITH DIFFERENTIAL/PLATELET
Basophils Absolute: 0.1 10*3/uL (ref 0.0–0.1)
Basophils Relative: 1 % (ref 0–1)
EOS ABS: 0.6 10*3/uL (ref 0.0–0.7)
Eosinophils Relative: 7 % — ABNORMAL HIGH (ref 0–5)
HEMATOCRIT: 40.6 % (ref 36.0–46.0)
HEMOGLOBIN: 13.7 g/dL (ref 12.0–15.0)
LYMPHS ABS: 2.1 10*3/uL (ref 0.7–4.0)
Lymphocytes Relative: 25 % (ref 12–46)
MCH: 29.5 pg (ref 26.0–34.0)
MCHC: 33.7 g/dL (ref 30.0–36.0)
MCV: 87.3 fL (ref 78.0–100.0)
MONO ABS: 0.6 10*3/uL (ref 0.1–1.0)
MPV: 9.5 fL (ref 8.6–12.4)
Monocytes Relative: 7 % (ref 3–12)
NEUTROS ABS: 5 10*3/uL (ref 1.7–7.7)
NEUTROS PCT: 60 % (ref 43–77)
Platelets: 277 10*3/uL (ref 150–400)
RBC: 4.65 MIL/uL (ref 3.87–5.11)
RDW: 13.5 % (ref 11.5–15.5)
WBC: 8.4 10*3/uL (ref 4.0–10.5)

## 2014-05-11 LAB — VITAMIN B12: Vitamin B-12: 625 pg/mL (ref 211–911)

## 2014-05-11 LAB — RPR

## 2014-05-11 LAB — FOLATE: FOLATE: 14.2 ng/mL

## 2014-05-11 LAB — HEMOGLOBIN A1C
Hgb A1c MFr Bld: 5.4 % (ref <5.7)
MEAN PLASMA GLUCOSE: 108 mg/dL (ref <117)

## 2014-05-11 LAB — TSH: TSH: 0.934 u[IU]/mL (ref 0.350–4.500)

## 2014-05-12 NOTE — Assessment & Plan Note (Signed)
New.  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  Will hold on starting Gabapentin as this may mask pt's sxs for upcoming neuro appt.  Will refer to Dr Allena KatzPatel as pt likely needs to repeat nerve conduction studies.  Pt expressed understanding and is in agreement w/ plan.

## 2014-05-13 ENCOUNTER — Encounter: Payer: Self-pay | Admitting: General Practice

## 2014-06-04 ENCOUNTER — Ambulatory Visit (INDEPENDENT_AMBULATORY_CARE_PROVIDER_SITE_OTHER): Payer: BLUE CROSS/BLUE SHIELD | Admitting: Neurology

## 2014-06-04 ENCOUNTER — Encounter: Payer: Self-pay | Admitting: Neurology

## 2014-06-04 VITALS — BP 110/78 | HR 62 | Ht 68.0 in | Wt 211.3 lb

## 2014-06-04 DIAGNOSIS — G609 Hereditary and idiopathic neuropathy, unspecified: Secondary | ICD-10-CM

## 2014-06-04 DIAGNOSIS — R202 Paresthesia of skin: Secondary | ICD-10-CM

## 2014-06-04 NOTE — Patient Instructions (Signed)
1.  Check 2-hr glucose tolerance test, copper, zinc, SPEP/UPEP with IFE, ferritin 2.  EMG of the left arm and leg 3.  Based on the results of your EMG, we can consider the appropriate medication 4.  Return to clinic in 2 months.

## 2014-06-04 NOTE — Progress Notes (Signed)
[facility] Neurology Division Clinic Note - Initial Visit   Date: 06/04/2014  Jaquaya Coyle MRN: [id number] DOB: 09-07-1970   Dear Dr. Beverely Low:  Thank you for your kind referral of Ravina Milner for consultation of paresthesias of the hands and feet. Although her history is well known to you, please allow [place] to reiterate it for the purpose of our medical record. The patient was accompanied to the clinic by self.    History of Present Illness: Jenavive Lamboy is a 44 y.o. right-handed Caucasian female with anxiety/depression presenting for evaluation of paresthesias of her hands and feet.    Starting around mid-2000s, she developed numbness of the tips of the toes which has become more constant.  She also complains of a painful sensation down her lower legs (calf, feet) and around her elbows, which can radiate into her forearm or upper arm.  It is worse in the evening when she is sitting and relaxaing. It is improved if she reclines or lays in the bed and if she walks around.  On NYE, symptoms worsened because she was unable to sit or stand for longer than 10 minutes.  Since then, she has noticed that symptoms have improved.  She also complains of leg cramps.  No associated weakness, neck or back discomfort.   She saw Dr. Sandria Manly and was given a trial of medication for restless leg syndrome, but there was no benefit.  She also had an EMG but does not recall the results.  She previously used to have headaches, but this has been well controlled over the past several years.  Her maternal aunt and materal grandmother had migraines.   Out-side paper records, electronic medical record, and images have been reviewed where available and summarized as:  Lab Results  Component Value Date   TSH 0.934 05/10/2014   Lab Results  Component Value Date   VITAMINB12 625 05/10/2014   Lab Results  Component Value Date   FOLATE 14.2 05/10/2014     Past Medical History  Diagnosis Date  .  Anxiety   . Depression   . Pulmonary embolism 2008    released by MD, result of birth control    Past Surgical History  Procedure Laterality Date  . Tubal ligation    . Laparoscopic assisted vaginal hysterectomy  03/16/2012    Procedure: LAPAROSCOPIC ASSISTED VAGINAL HYSTERECTOMY;  Surgeon: Zelphia Cairo, MD;  Location: [facility];  Service: Gynecology;  Laterality: N/A;     Medications:  Current Outpatient Prescriptions on File Prior to Visit  Medication Sig Dispense Refill  . escitalopram (LEXAPRO) 10 MG tablet 10 mg daily.     Marland Kitchen ibuprofen (ADVIL,MOTRIN) 600 MG tablet Take 600 mg by mouth every 6 (six) hours as needed. For headache/ general pain.    . Multiple Vitamin (MULTIVITAMIN WITH MINERALS) TABS Take 1 tablet by mouth daily.     No current facility-administered medications on file prior to visit.    Allergies:  Allergies  Allergen Reactions  . Morphine     Headache.  . Prednisone Itching    Family History: Family History  Problem Relation Age of Onset  . Diabetes Maternal Uncle   . Heart disease Maternal Grandfather   . COPD Mother   . Healthy Father   . Thyroid disease Sister   . Healthy Brother     Social History: History   Social History  . Marital Status: Married    Spouse Name: N/A    Number of Children: N/A  .  Years of Education: N/A   Occupational History  . Not on file.   Social History Main Topics  . Smoking status: Never Smoker   . Smokeless tobacco: Never Used  . Alcohol Use: 1.8 oz/week    3 Glasses of wine per week     Comment: socially  . Drug Use: No  . Sexual Activity: Not on file   Other Topics Concern  . Not on file   Social History Narrative   Lives with husband and 2 children in a one story home.     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     Education: high school.    Review of Systems:  CONSTITUTIONAL: No fevers, chills, night sweats, or weight loss.   EYES: No visual changes or eye pain ENT:  No hearing changes.  No history of nose bleeds.   RESPIRATORY: No cough, wheezing and shortness of breath.   CARDIOVASCULAR: Negative for chest pain, and palpitations.   GI: Negative for abdominal discomfort, blood in stools or black stools.  No recent change in bowel habits.   GU:  No history of incontinence.   MUSCLOSKELETAL: No history of joint pain or swelling.  No myalgias.   SKIN: Negative for lesions, rash, and itching.   HEMATOLOGY/ONCOLOGY: Negative for prolonged bleeding, bruising easily, and swollen nodes.  No history of cancer.   ENDOCRINE: Negative for cold or heat intolerance, polydipsia or goiter.   PSYCH:  No depression or anxiety symptoms.   NEURO: As Above.   Vital Signs:  BP 110/78 mmHg  Pulse 62  Ht  (1.727 m)  Wt 211 lb 5 oz (95.851 kg)  BMI 32.14 kg/m2  SpO2 99%   General Medical Exam:   General:  Well appearing, comfortable.   Eyes/ENT: see cranial nerve examination.   Neck: No masses appreciated.  Full range of motion without tenderness.  No carotid bruits. Respiratory:  Clear to auscultation, good air entry bilaterally.   Cardiac:  Regular rate and rhythm, no murmur.   Extremities:  No deformities, edema, or skin discoloration.  Skin:  No rashes or lesions.  Neurological Exam: MENTAL STATUS including orientation to time, place, person, recent and remote memory, attention span and concentration, language, and fund of knowledge is normal.  Speech is not dysarthric.  CRANIAL NERVES: II:  No visual field defects.  Unremarkable fundi.   III-IV-VI: Pupils equal round and reactive to light.  Normal conjugate, extra-ocular eye movements in all directions of gaze.  No nystagmus.  No ptosis.   V:  Normal facial sensation.     VII:  Normal facial symmetry and movements.  No pathologic facial reflexes.  VIII:  Normal hearing and vestibular function.   IX-X:  Normal palatal movement.   XI:  Normal shoulder shrug and head rotation.   XII:  Normal tongue  strength and range of motion, no deviation or fasciculation.  MOTOR:  No atrophy, fasciculations or abnormal movements.  No pronator drift.  Tone is normal.    Right Upper Extremity:    Left Upper Extremity:    Deltoid  5/5   Deltoid  5/5   Biceps  5/5   Biceps  5/5   Triceps  5/5   Triceps  5/5   Wrist extensors  5/5   Wrist extensors  5/5   Wrist flexors  5/5   Wrist flexors  5/5   Finger extensors  5/5   Finger extensors  5/5   Finger flexors  5/5   Finger flexors  5/5   Dorsal interossei  5/5   Dorsal interossei  5/5   Abductor pollicis  5/5   Abductor pollicis  5/5   Tone (Ashworth scale)  0  Tone (Ashworth scale)  0   Right Lower Extremity:    Left Lower Extremity:    Hip flexors  5/5   Hip flexors  5/5   Hip extensors  5/5   Hip extensors  5/5   Knee flexors  5/5   Knee flexors  5/5   Knee extensors  5/5   Knee extensors  5/5   Dorsiflexors  5/5   Dorsiflexors  5/5   Plantarflexors  5/5   Plantarflexors  5/5   Toe extensors  5/5   Toe extensors  5/5   Toe flexors  5/5   Toe flexors  5/5   Tone (Ashworth scale)  0  Tone (Ashworth scale)  0   MSRs:  Right                                                                 Left brachioradialis 2+  brachioradialis 2+  biceps 2+  biceps 2+  triceps 2+  triceps 2+  patellar 2+  patellar 2+  ankle jerk 2+  ankle jerk 2+  Hoffman no  Hoffman no  plantar response down  plantar response down   SENSORY:  Normal and symmetric perception of light touch, pinprick, vibration, and proprioception.  Romberg's sign absent.   COORDINATION/GAIT: Normal finger-to- nose-finger and heel-to-shin.  Intact rapid alternating movements bilaterally.  Able to rise from a chair without using arms.  Gait narrow based and stable. Tandem and stressed gait intact.    IMPRESSION: Mrs. Doylene CanardConner is a 44 year-old female presenting for evaluation of bilateral feet paresthesias.  Her neurological examination shows mild hyperesthesia to pin prick over the feet,  otherwise exam is normal and non-focal.  Based on her history, she may have very early distal peripheral neuropathy involving the toes, however this would not explain her upper and lower leg sporadic symptoms, therefore restless leg syndrome needs to also be considered.  EMG of the left arm and leg will be ordered to evaluate for neuropathy.    PLAN/RECOMMENDATIONS:  1.  Check 2-hr glucose tolerance test, copper, zinc, SPEP/UPEP with IFE, ferritin 2.  EMG of the left arm and leg 3.  Based on the results of EMG, we can consider the appropriate medication 4.  Return to clinic in 722-months.   The duration of this appointment visit was 45 minutes of face-to-face time with the patient.  Greater than 50% of this time was spent in counseling, explanation of diagnosis, planning of further management, and coordination of care.   Thank you for allowing me to participate in patient's care.  If I can answer any additional questions, I would be pleased to do so.    Sincerely,    Donika K. Allena KatzPatel, DO

## 2014-06-05 LAB — FERRITIN: FERRITIN: 116 ng/mL (ref 10–291)

## 2014-06-06 LAB — SPEP & IFE WITH QIG
ALBUMIN ELP: 56.7 % (ref 55.8–66.1)
ALPHA-1-GLOBULIN: 3.7 % (ref 2.9–4.9)
Alpha-2-Globulin: 9.5 % (ref 7.1–11.8)
BETA GLOBULIN: 5.9 % (ref 4.7–7.2)
Beta 2: 5 % (ref 3.2–6.5)
Gamma Globulin: 19.2 % — ABNORMAL HIGH (ref 11.1–18.8)
IgA: 187 mg/dL (ref 69–380)
IgG (Immunoglobin G), Serum: 1320 mg/dL (ref 690–1700)
IgM, Serum: 168 mg/dL (ref 52–322)
Total Protein, Serum Electrophoresis: 7.6 g/dL (ref 6.0–8.3)

## 2014-06-06 LAB — UIFE/LIGHT CHAINS/TP QN, 24-HR UR
Albumin, U: DETECTED
Total Protein, Urine: 4 mg/dL — ABNORMAL LOW (ref 5–24)

## 2014-06-06 LAB — COPPER, SERUM: COPPER: 121 ug/dL (ref 70–175)

## 2014-06-06 LAB — ZINC: ZINC: 76 ug/dL (ref 60–130)

## 2014-06-06 NOTE — Progress Notes (Signed)
Addendum  Records rec'd from [facility].    EMG of the bilateral lower extremities 05/31/2008:  Normal study.    Clinic note dated 05/17/2008 by Dr. Terrace ArabiaYan noted that she was to try ropinorole 0.5mg  qhs for possible RLS.  Gwenlyn Hottinger K. Allena KatzPatel, DO

## 2014-07-02 ENCOUNTER — Other Ambulatory Visit: Payer: Self-pay | Admitting: Obstetrics and Gynecology

## 2014-07-03 LAB — CYTOLOGY - PAP

## 2014-07-15 ENCOUNTER — Ambulatory Visit (INDEPENDENT_AMBULATORY_CARE_PROVIDER_SITE_OTHER): Payer: BLUE CROSS/BLUE SHIELD | Admitting: Neurology

## 2014-07-15 DIAGNOSIS — R202 Paresthesia of skin: Secondary | ICD-10-CM

## 2014-07-15 DIAGNOSIS — G609 Hereditary and idiopathic neuropathy, unspecified: Secondary | ICD-10-CM

## 2014-07-15 NOTE — Procedures (Signed)
[facility] Neurology  [address] [place], [address]  [place], [place] [address] Tel: [phone] Fax:  [phone] Test Date:  07/15/2014  Patient: Diane Shaffer DOB: March 28, 1971 Physician: Nita Sickle, DO  Sex: Female Height:  Ref Phys: Nita Sickle  ID#: [id number] Temp: 37.0C Technician: Ala Bent R. NCS T.   Patient Complaints: Patient is a 44 year old female here for evaluation of paresthesias in her two feet and hands.  NCV & EMG Findings: Extensive electrodiagnostic testing of the left upper and lower extremity shows:  1. Median, ulnar, radial, and palmar sensory responses are within normal limits. 2. Median and ulnar motor responses are within normal limits.  3. Sural and superficial peroneal sensory responses are within normal limits. 4. Tibial and peroneal motor responses are within normal limits. 5. There is no evidence of active or chronic motor axon loss changes affecting any of the tested muscles. Motor unit configuration and recruitment pattern is within normal limits.  Impression: This is a normal study of the left upper and lower extremities. In particular, there is no evidence of a generalized sensorimotor polyneuropathy, carpal tunnel syndrome, or a cervical/lumbosacral radiculopathy affecting the left side.   ___________________________ Nita Sickle, DO    Nerve Conduction Studies Anti Sensory Summary Table   Site NR Peak (ms) Norm Peak (ms) P-T Amp (V) Norm P-T Amp  Left Median Anti Sensory (2nd Digit)  37C  Wrist    2.7 <3.4 57.4 >20  Left Radial Anti Sensory (Base 1st Digit)  37C  Wrist    1.9 <2.7 36.8 >18  Left Sup Peroneal Anti Sensory (Ant Lat Mall)  35C  12 cm    2.3 <4.5 9.0 >5  Left Sural Anti Sensory (Lat Mall)  35C  Calf    3.5 <4.5 6.7 >5  Left Ulnar Anti Sensory (5th Digit)  37C  Wrist    2.4 <3.1 24.7 >12   Motor Summary Table   Site NR Onset (ms) Norm Onset (ms) O-P Amp (mV) Norm O-P Amp Site1 Site2 Delta-0 (ms) Dist  (cm) Vel (m/s) Norm Vel (m/s)  Left Median Motor (Abd Poll Brev)  37C  Wrist    2.5 <3.9 11.4 >6 Elbow Wrist 4.5 28.0 62 >50  Elbow    7.0  11.0         Left Peroneal Motor (Ext Dig Brev)  35C  Ankle    2.7 <5.5 5.1 >3 B Fib Ankle 7.6 38.0 50 >40  B Fib    10.3  3.9  Poplt B Fib 1.2 7.0 58 >40  Poplt    11.5  3.8         Left Tibial Motor (Abd Hall Brev)  35C  Ankle    3.8 <6.0 12.8 >8 Knee Ankle 7.3 45.0 62 >40  Knee    11.1  10.4         Left Ulnar Motor (Abd Dig Minimi)  37C  Wrist    1.5 <3.1 8.7 >7 B Elbow Wrist 4.3 24.5 57 >50  B Elbow    5.8  8.9  A Elbow B Elbow 1.5 10.0 67 >50  A Elbow    7.3  8.5          Comparison Summary Table   Site NR Peak (ms) Norm Peak (ms) P-T Amp (V) Site1 Site2 Delta-P (ms) Norm Delta (ms)  Left Median/Ulnar Palm Comparison (Wrist - 8cm)  37C  Median Palm    1.7 <2.2 68.5 Median Palm Ulnar  Palm 0.1   Ulnar Palm    1.6 <2.2 19.7       EMG   Side Muscle Ins Act Fibs Psw Fasc Number Recrt Dur Dur. Amp Amp. Poly Poly. Comment  Left 1stDorInt Nml Nml Nml Nml Nml Nml Nml Nml Nml Nml Nml Nml N/A  Left Ext Indicis Nml Nml Nml Nml Nml Nml Nml Nml Nml Nml Nml Nml N/A  Left PronatorTeres Nml Nml Nml Nml Nml Nml Nml Nml Nml Nml Nml Nml N/A  Left Biceps Nml Nml Nml Nml Nml Nml Nml Nml Nml Nml Nml Nml N/A  Left Triceps Nml Nml Nml Nml Nml Nml Nml Nml Nml Nml Nml Nml N/A  Left Deltoid Nml Nml Nml Nml Nml Nml Nml Nml Nml Nml Nml Nml N/A  Left AntTibialis Nml Nml Nml Nml Nml Nml Nml Nml Nml Nml Nml Nml N/A  Left Gastroc Nml Nml Nml Nml Nml Nml Nml Nml Nml Nml Nml Nml N/A  Left Flex Dig Long Nml Nml Nml Nml Nml Nml Nml Nml Nml Nml Nml Nml N/A  Left RectFemoris Nml Nml Nml Nml Nml Nml Nml Nml Nml Nml Nml Nml N/A  Left GluteusMed Nml Nml Nml Nml Nml Nml Nml Nml Nml Nml Nml Nml N/A      Waveforms:

## 2014-07-16 ENCOUNTER — Encounter: Payer: Self-pay | Admitting: Neurology

## 2014-08-02 ENCOUNTER — Encounter: Payer: Self-pay | Admitting: Neurology

## 2014-08-02 ENCOUNTER — Ambulatory Visit (INDEPENDENT_AMBULATORY_CARE_PROVIDER_SITE_OTHER): Payer: BLUE CROSS/BLUE SHIELD | Admitting: Neurology

## 2014-08-02 VITALS — BP 100/70 | HR 61 | Ht 68.0 in | Wt 211.5 lb

## 2014-08-02 DIAGNOSIS — G43109 Migraine with aura, not intractable, without status migrainosus: Secondary | ICD-10-CM | POA: Diagnosis not present

## 2014-08-02 DIAGNOSIS — R202 Paresthesia of skin: Secondary | ICD-10-CM | POA: Diagnosis not present

## 2014-08-02 MED ORDER — GABAPENTIN 100 MG PO CAPS: ORAL_CAPSULE | ORAL | Status: DC

## 2014-08-02 NOTE — Progress Notes (Signed)
Follow-up Visit   Date: 08/02/2014    Diane Shaffer MRN: [id number] DOB: 18-Jul-1970   Interim History: Diane Shaffer is a 44 y.o. right-handed Caucasian female with anxiety/depression returning to the clinic for follow-up of generalized paresthesias.  The patient was accompanied to the clinic by self.  History of present illness: Starting around mid-2000s, she developed numbness of the tips of the toes which has become more constant. She also complains of a painful sensation down her lower legs (calf, feet) and around her elbows, which can radiate into her forearm or upper arm. It is worse in the evening when she is sitting and relaxaing. It is improved if she reclines or lays in the bed and if she walks around. On NYE, symptoms worsened because she was unable to sit or stand for longer than 10 minutes. Since then, she has noticed that symptoms have improved. She also complains of leg cramps. No associated weakness, neck or back discomfort.   She saw Dr. Sandria Manly and was given a trial of medication for restless leg syndrome, but there was no benefit. She also had an EMG but does not recall the results.  She previously used to have headaches, but this has been well controlled over the past several years. Her maternal aunt and materal grandmother had migraines.  EMG of the bilateral lower extremities 05/31/2008: Normal study.   Clinic note dated 05/17/2008 by Dr. Terrace Arabia noted that she was to try ropinorole 0.5mg  qhs for possible RLS.  UPDATE 08/02/2014:  She continues to have sporadic numbness involving the toes especially at night.  Sometimes it will involve random places involving the knees, arms, and lower legs.  EMG returned normal without evidence of neuropathy.  No new neurological complaints.  Medications:  Current Outpatient Prescriptions on File Prior to Visit  Medication Sig Dispense Refill  . ibuprofen (ADVIL,MOTRIN) 600 MG tablet Take 600 mg by mouth every 6 (six)  hours as needed. For headache/ general pain.    . Multiple Vitamin (MULTIVITAMIN WITH MINERALS) TABS Take 1 tablet by mouth daily.     No current facility-administered medications on file prior to visit.    Allergies:  Allergies  Allergen Reactions  . Morphine     Headache.  . Prednisone Itching    Review of Systems:  CONSTITUTIONAL: No fevers, chills, night sweats, or weight loss.  EYES: No visual changes or eye pain ENT: No hearing changes.  No history of nose bleeds.   RESPIRATORY: No cough, wheezing and shortness of breath.   CARDIOVASCULAR: Negative for chest pain, and palpitations.   GI: Negative for abdominal discomfort, blood in stools or black stools.  No recent change in bowel habits.   GU:  No history of incontinence.   MUSCLOSKELETAL: No history of joint pain or swelling.  No myalgias.   SKIN: Negative for lesions, rash, and itching.   ENDOCRINE: Negative for cold or heat intolerance, polydipsia or goiter.   PSYCH:  No depression or anxiety symptoms.   NEURO: As Above.   Vital Signs:  BP 100/70 mmHg  Pulse 61  Ht  (1.727 m)  Wt 211 lb 8 oz (95.936 kg)  BMI 32.17 kg/m2  SpO2 95%   Neurological Exam: MENTAL STATUS including orientation to time, place, person, recent and remote memory, attention span and concentration, language, and fund of knowledge is normal.  Speech is not dysarthric.  CRANIAL NERVES:   Face is symmetric.   MOTOR:  Motor strength is 5/5 in all extremities.  Tone is normal.    MSRs:  Reflexes are brisk 2+/4 throughout  SENSORY:  Intact to vibration throughout.  COORDINATION/GAIT:  Normal finger-to- nose-finger and heel-to-shin.  Intact rapid alternating movements bilaterally.  Gait narrow based and stable.   Data: Labs 05/10/2014:  TSH 0/934, HbA1c 5.4, vitamin B12 625, folate 14.2 Labs 06/04/2014:  ferritin 116, SPEP/UPEP with IFE no M protein, zinc 76, copper 121  EMG left upper and lower extremities 07/15/2014: This is a normal  study of the left upper and lower extremities.  In particular, there is no evidence of a generalized sensorimotor polyneuropathy, carpal tunnel syndrome, or a cervical/lumbosacral radiculopathy affecting the left side.  CT head 07/26/2005: Negative non-contrast head CT.  IMPRESSION/PLAN: Diane Shaffer is a 44 year-old female returning for evaluation of bilateral feet paresthesias. Her neurological examination shows mild hyperesthesia to pin prick over the feet, otherwise exam is normal and non-focal. Based on her history, she may have very early distal peripheral neuropathy involving the toes, however this would not explain her upper and lower leg sporadic symptoms and there is no evidence of neuropathy on EMG.  Possibilities include migraine equivalent syndrome or RLS.  We discussed options of management and decided on a trial of gabapentin.  If no improvement, alternative medications can be tried.  She has only tried Requip in the past.  I also offered MRI brain to exclude the possibility of demyelinating disease, but given low pre-test probability we decided to hold off unless symptoms were to worsen or she develop new neurological complaints.   PLAN/RECOMMENDATIONS:  1.  Start gabapentin 100mg  1 tablet at bedtime x 3 days and uptitrate to 300mg  qhs (schedule provided) 2.  Return to clinic in 3 months  The duration of this appointment visit was 25 minutes of face-to-face time with the patient.  Greater than 50% of this time was spent in counseling, explanation of diagnosis, planning of further management, and coordination of care.   Thank you for allowing me to participate in patient's care.  If I can answer any additional questions, I would be pleased to do so.    Sincerely,    Emilya Justen K. Allena KatzPatel, DO

## 2014-08-02 NOTE — Patient Instructions (Addendum)
1.  Start gabapentin 100mg  1 tablet at bedtime x 3 days, then increase to 2 tablets at bedtime x 3 days, then 3 tablets at bedtime and continue 2.  Call or send me an update of how you are doing in a month 3.  Return to clinic in 3 months

## 2014-08-14 ENCOUNTER — Telehealth: Payer: Self-pay | Admitting: Neurology

## 2014-08-14 NOTE — Telephone Encounter (Signed)
Pt called wanting to speak to a nurse regarding her current condition. She is having trouble with vision. C/b [phone]

## 2014-08-14 NOTE — Telephone Encounter (Signed)
Patient has had vision changes in the past month and hiccups every time she eats.  The hiccups caused her to vomit yesterday at lunch.  She is concerned that she may have MS.  Please advise.

## 2014-08-15 ENCOUNTER — Other Ambulatory Visit: Payer: Self-pay | Admitting: *Deleted

## 2014-08-15 DIAGNOSIS — H539 Unspecified visual disturbance: Secondary | ICD-10-CM

## 2014-08-15 NOTE — Telephone Encounter (Signed)
OK to order MRI brain wwo contrast, please also have her see her eye doctor for an exam.  Diane K. Allena KatzPatel, DO

## 2014-08-15 NOTE — Telephone Encounter (Signed)
Patient notified that MRI has been ordered and I instructed her to have eye exam.  Called [facility] and they will call patient to schedule the appointment.

## 2014-08-19 ENCOUNTER — Ambulatory Visit
Admission: RE | Admit: 2014-08-19 | Discharge: 2014-08-19 | Disposition: A | Discharge status: Home / Self Care | Payer: BLUE CROSS/BLUE SHIELD | Source: Ambulatory Visit | Attending: Neurology | Admitting: Neurology

## 2014-08-19 DIAGNOSIS — H539 Unspecified visual disturbance: Secondary | ICD-10-CM

## 2014-08-19 MED ORDER — GADOBENATE DIMEGLUMINE 529 MG/ML IV SOLN
20.0000 mL | Freq: Once | INTRAVENOUS | Status: AC | PRN
  Administered 2014-08-19: 20 mL via INTRAVENOUS

## 2014-11-01 ENCOUNTER — Ambulatory Visit: Payer: BLUE CROSS/BLUE SHIELD | Admitting: Neurology

## 2016-08-03 ENCOUNTER — Ambulatory Visit (INDEPENDENT_AMBULATORY_CARE_PROVIDER_SITE_OTHER): Payer: BLUE CROSS/BLUE SHIELD | Admitting: Sports Medicine

## 2016-08-03 ENCOUNTER — Encounter: Payer: Self-pay | Admitting: Sports Medicine

## 2016-08-03 ENCOUNTER — Ambulatory Visit (INDEPENDENT_AMBULATORY_CARE_PROVIDER_SITE_OTHER): Payer: BLUE CROSS/BLUE SHIELD

## 2016-08-03 ENCOUNTER — Telehealth: Payer: Self-pay | Admitting: *Deleted

## 2016-08-03 DIAGNOSIS — M79671 Pain in right foot: Secondary | ICD-10-CM | POA: Diagnosis not present

## 2016-08-03 DIAGNOSIS — M25571 Pain in right ankle and joints of right foot: Secondary | ICD-10-CM

## 2016-08-03 DIAGNOSIS — M25572 Pain in left ankle and joints of left foot: Secondary | ICD-10-CM

## 2016-08-03 DIAGNOSIS — M722 Plantar fascial fibromatosis: Secondary | ICD-10-CM

## 2016-08-03 DIAGNOSIS — R609 Edema, unspecified: Secondary | ICD-10-CM

## 2016-08-03 DIAGNOSIS — I739 Peripheral vascular disease, unspecified: Secondary | ICD-10-CM | POA: Diagnosis not present

## 2016-08-03 MED ORDER — METHYLPREDNISOLONE 4 MG PO TBPK: ORAL_TABLET | ORAL | 0 refills | Status: DC

## 2016-08-03 MED ORDER — TRIAMCINOLONE ACETONIDE 10 MG/ML IJ SUSP: 10.0000 mg | Freq: Once | INTRAMUSCULAR | Status: DC

## 2016-08-03 NOTE — Telephone Encounter (Addendum)
-----   Message from [place], [place] sent at 08/03/2016 11:26 AM EDT ----- Regarding: Venous reflux Swelling in feet and ankles bilateral. Faxed orders to [facility].08/04/2016- Pt states her prescription from yesterday has not been called to the pharmacy. I reviewed Medications ordered and Medrol dose pack was escribed to [id number] yesterday at 9:34pm. Left message informing pt of the confirmation of the orders to [id number].

## 2016-08-03 NOTE — Progress Notes (Signed)
Subjective: Diane Shaffer is a 46 y.o. female patient presents to office with complaint of heel pain on the right and swelling in both feet and ankles. 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 in duration. Patient has treated this problem with OTC inserts with no relief. Swelling both ankles by end of day with achy pain has tried ice, elevation and NSAIDs with no relief. Denies any other pedal complaints.   Patient Active Problem List   Diagnosis Date Noted  . Numbness and tingling 05/10/2014  . Physical exam, annual 11/30/2011  . Tremor of unknown origin 11/30/2011  . Cold intolerance 11/30/2011  . LACERATION 11/21/2008  . LEG CRAMPS, NOCTURNAL 04/19/2008  . DIZZINESS 04/19/2008  . PULMONARY EMBOLISM, HX OF 04/19/2008  . EMBOLISM/INFARCTION, PULMONARY NEC 05/11/2006    Current Outpatient Prescriptions on File Prior to Visit  Medication Sig Dispense Refill  . escitalopram (LEXAPRO) 20 MG tablet   11   No current facility-administered medications on file prior to visit.     Allergies  Allergen Reactions  . Morphine     Headache.    Objective: Physical Exam General: The patient is alert and oriented x3 in no acute distress.  Dermatology: Skin is warm, dry and supple bilateral lower extremities. Nails 1-10 are normal. There is no erythema, no eccymosis, no open lesions present. Integument is otherwise unremarkable.  Vascular: Dorsalis Pedis pulse and Posterior Tibial pulse are 2/4 bilateral. Capillary fill time is immediate to all digits. 1+ pitting edema bilateral ankles.   Neurological: Grossly intact to light touch with an achilles reflex of +2/5 and a negative Tinel's sign bilateral.  Musculoskeletal: Tenderness to palpation at the medial calcaneal tubercale and through the insertion of the plantar fascia on the right foot. No pain with compression of calcaneus bilateral. No pain with tuning fork to calcaneus bilateral. No pain with calf compression bilateral.  There is decreased Ankle joint range of motion bilateral. All other joints range of motion within normal limits bilateral. Strength 5/5 in all groups bilateral.   Xray, Right/Left foot:  Normal osseous mineralization. Joint spaces preserved. No fracture/dislocation/boney destruction. Calcaneal spur present with mild thickening of plantar fascia R>L. No other soft tissue abnormalities or radiopaque foreign bodies.   Assessment and Plan: Problem List Items Addressed This Visit    None    Visit Diagnoses    Plantar fasciitis, right    -  Primary   Relevant Medications   methylPREDNISolone (MEDROL DOSEPAK) 4 MG TBPK tablet   triamcinolone acetonide (KENALOG) 10 MG/ML injection 10 mg   Bilateral ankle pain, unspecified chronicity       Relevant Orders   DG Ankle Complete Right (Completed)   DG Ankle Complete Left (Completed)   Right foot pain       Relevant Orders   DG Foot 2 Views Right (Completed)   PVD (peripheral vascular disease) (HCC)       Swelling         -Complete examination performed.  -Xrays reviewed -Discussed with patient in detail the condition of plantar fasciitis, how this occurs and general treatment options. Explained both conservative and surgical treatments.  -After oral consent and aseptic prep, injected a mixture containing 1 ml of 2% plain lidocaine, 1 ml 0.5% plain marcaine, 0.5 ml of kenalog 10 and 0.5 ml of dexamethasone phosphate into right heel. Post-injection care discussed with patient.  -Rx Medrol dose pack and instructed motrin as needed once completed  -Recommended good supportive shoes and advised use of  OTC insert. Explained to patient that if these orthoses work well, we will continue with these. If these do not improve her condition and  pain, we will consider custom molded orthoses. -Explained and dispensed to patient daily stretching exercises. -Recommend patient to ice affected area 1-2x daily. -Rx Venous reflux testing to evaluate for PVD and need  for compression stocking for swelling at ankles  -Patient to return to office in 4 weeks for follow up or sooner if problems or questions arise.  Asencion Islam, DPM

## 2016-08-03 NOTE — Progress Notes (Signed)
   Subjective:    Patient ID: Diane Shaffer, female    DOB: [birth year], 46 y.o.   MRN: [id number]  HPI  Chief Complaint  Patient presents with  . Ankle Pain    BL; Ankle Pain & Swelling x "been going on for a couple of years happening more frequently lately". Pt works at a [employer] and is on her feet a lot.  . Foot Pain    Right; Arch and Bottom of heel x 6 months.        Review of Systems     Objective:   Physical Exam        Assessment & Plan:

## 2016-08-03 NOTE — Patient Instructions (Signed)

## 2016-08-09 ENCOUNTER — Ambulatory Visit (HOSPITAL_COMMUNITY)
Admission: RE | Admit: 2016-08-09 | Discharge: 2016-08-09 | Disposition: A | Discharge status: Home / Self Care | Payer: BLUE CROSS/BLUE SHIELD | Source: Ambulatory Visit | Attending: Surgery | Admitting: Surgery

## 2016-08-09 DIAGNOSIS — R609 Edema, unspecified: Secondary | ICD-10-CM | POA: Diagnosis present

## 2016-08-09 DIAGNOSIS — I872 Venous insufficiency (chronic) (peripheral): Secondary | ICD-10-CM | POA: Insufficient documentation

## 2016-08-31 ENCOUNTER — Ambulatory Visit: Payer: BLUE CROSS/BLUE SHIELD | Admitting: Sports Medicine

## 2016-09-01 ENCOUNTER — Encounter: Payer: Self-pay | Admitting: Sports Medicine

## 2016-09-01 ENCOUNTER — Ambulatory Visit (INDEPENDENT_AMBULATORY_CARE_PROVIDER_SITE_OTHER): Payer: BLUE CROSS/BLUE SHIELD | Admitting: Sports Medicine

## 2016-09-01 DIAGNOSIS — M722 Plantar fascial fibromatosis: Secondary | ICD-10-CM | POA: Diagnosis not present

## 2016-09-01 DIAGNOSIS — M25571 Pain in right ankle and joints of right foot: Secondary | ICD-10-CM

## 2016-09-01 DIAGNOSIS — R609 Edema, unspecified: Secondary | ICD-10-CM

## 2016-09-01 DIAGNOSIS — M25572 Pain in left ankle and joints of left foot: Secondary | ICD-10-CM

## 2016-09-01 DIAGNOSIS — I739 Peripheral vascular disease, unspecified: Secondary | ICD-10-CM

## 2016-09-01 MED ORDER — TRIAMCINOLONE ACETONIDE 10 MG/ML IJ SUSP: 10.0000 mg | Freq: Once | INTRAMUSCULAR | Status: DC

## 2016-09-01 NOTE — Progress Notes (Signed)
Subjective: Diane Shaffer is a 46 y.o. female returns to office for follow up evaluation after Right heel injection for plantar fasciitis, injection #1 administered 4 weeks ago. Patient states that the injection seems to help her pain for about 1 week; pain is still present and feels like it has moved to a different area of the heel. Patient also reports that she did some traveling out of town and is still experienced swelling. Patient reports that she went for a circulation test. Patient denies any recent changes in medications or new problems since last visit.   Patient Active Problem List   Diagnosis Date Noted  . Numbness and tingling 05/10/2014  . Physical exam, annual 11/30/2011  . Tremor of unknown origin 11/30/2011  . Cold intolerance 11/30/2011  . LACERATION 11/21/2008  . LEG CRAMPS, NOCTURNAL 04/19/2008  . DIZZINESS 04/19/2008  . PULMONARY EMBOLISM, HX OF 04/19/2008  . EMBOLISM/INFARCTION, PULMONARY NEC 05/11/2006    Current Outpatient Prescriptions on File Prior to Visit  Medication Sig Dispense Refill  . escitalopram (LEXAPRO) 20 MG tablet   11  . methylPREDNISolone (MEDROL DOSEPAK) 4 MG TBPK tablet Take as instructed. Once completed may take motrin from any pain and inflammation 21 tablet 0   Current Facility-Administered Medications on File Prior to Visit  Medication Dose Route Frequency Provider Last Rate Last Dose  . triamcinolone acetonide (KENALOG) 10 MG/ML injection 10 mg  10 mg Other Once Asencion Islam, DPM        Allergies  Allergen Reactions  . Morphine     Headache.    Objective:   General:  Alert and oriented x 3, in no acute distress  Dermatology: Skin is warm, dry, and supple bilateral. Nails are within normal limits. There is no lower extremity erythema, no eccymosis, no open lesions present bilateral.   Vascular: Dorsalis Pedis and Posterior Tibial pedal pulses are 2/4 bilateral. + hair growth noted bilateral. Capillary Fill Time is 3 seconds in  all digits. No varicosities, Trace edema bilateral lower extremities.   Neurological: Sensation grossly intact to light touch with an achilles reflex of +2 and a  negative Tinel's sign bilateral. Vibratory, sharp/dull, Semmes Weinstein Monofilament within normal limits.   Musculoskeletal: There is decreased tenderness to palpation at the medial calcaneal tubercale and through the insertion of the plantar fascia on the right foot however, there is more intense pain at the central heel and at the lateral heel at the insertion of the plantar fascia on the right. No pain with compression to calcaneus or application of tuning fork. There is decreased Ankle joint range of motion bilateral. All other joints range of motion  within normal limits bilateral. Strength 5/5 bilateral.   Assessment and Plan: Problem List Items Addressed This Visit    None    Visit Diagnoses    Plantar fasciitis, right    -  Primary   Relevant Medications   triamcinolone acetonide (KENALOG) 10 MG/ML injection 10 mg (Start on 09/01/2016  6:15 PM)   PVD (peripheral vascular disease) (HCC)       Swelling       Bilateral ankle pain, unspecified chronicity          -Complete examination performed.  -Previous x-rays reviewed. -Discussed with patient in detail the condition of plantar fasciitis, how this  occurs related to the foot type of the patient and general treatment options. - Patient opted for another injection today; After oral consent and aseptic prep, injected a mixture containing 1 ml of  1%plain lidocaine, 1 ml 0.5% plain marcaine, 0.5 ml of kenalog 40 and 0.5 ml of dexmethasone phosphate to right heel at area of most pain/trigger point injection. -Dispensed fascial brace for the right -Continue with stretching, icing, good supportive shoes, inserts daily.  -Discussed long term care and reocurrence; will closely monitor; if fails to improve will consider other treatment modalities.  -Recommend patient to go to  elastic therapy for compression garment of at least 15 mmHg for positive venous reflux test -Patient to return to office in 3-4 weeks for follow up or sooner if problems or questions arise.  Asencion Islam, DPM

## 2016-10-05 ENCOUNTER — Encounter: Payer: Self-pay | Admitting: Sports Medicine

## 2016-10-05 ENCOUNTER — Ambulatory Visit (INDEPENDENT_AMBULATORY_CARE_PROVIDER_SITE_OTHER): Payer: BLUE CROSS/BLUE SHIELD | Admitting: Sports Medicine

## 2016-10-05 DIAGNOSIS — M79671 Pain in right foot: Secondary | ICD-10-CM | POA: Diagnosis not present

## 2016-10-05 DIAGNOSIS — R609 Edema, unspecified: Secondary | ICD-10-CM | POA: Diagnosis not present

## 2016-10-05 DIAGNOSIS — I739 Peripheral vascular disease, unspecified: Secondary | ICD-10-CM | POA: Diagnosis not present

## 2016-10-05 DIAGNOSIS — M722 Plantar fascial fibromatosis: Secondary | ICD-10-CM

## 2016-10-05 MED ORDER — LIDOCAINE 5 % EX PTCH: 1.0000 | MEDICATED_PATCH | CUTANEOUS | 0 refills | Status: DC

## 2016-10-05 NOTE — Progress Notes (Signed)
Subjective: Diane Shaffer is a 46 y.o. female returns to office for follow up evaluation after Right heel injection for plantar fasciitis, injection #2 administered 4 weeks ago. Patient states that pain is still the same with no improvement. Patient reports that brace seemed to help some but now worn and no longer provides support. Still post static dyskensia after sitting. Patient states that even with driving heel hurts. No other issues noted.   Patient Active Problem List   Diagnosis Date Noted  . Numbness and tingling 05/10/2014  . Physical exam, annual 11/30/2011  . Tremor of unknown origin 11/30/2011  . Cold intolerance 11/30/2011  . LACERATION 11/21/2008  . LEG CRAMPS, NOCTURNAL 04/19/2008  . DIZZINESS 04/19/2008  . PULMONARY EMBOLISM, HX OF 04/19/2008  . EMBOLISM/INFARCTION, PULMONARY NEC 05/11/2006    Current Outpatient Prescriptions on File Prior to Visit  Medication Sig Dispense Refill  . escitalopram (LEXAPRO) 20 MG tablet   11  . methylPREDNISolone (MEDROL DOSEPAK) 4 MG TBPK tablet Take as instructed. Once completed may take motrin from any pain and inflammation 21 tablet 0   Current Facility-Administered Medications on File Prior to Visit  Medication Dose Route Frequency Provider Last Rate Last Dose  . triamcinolone acetonide (KENALOG) 10 MG/ML injection 10 mg  10 mg Other Once Asencion IslamStover, Issa Kosmicki, DPM      . triamcinolone acetonide (KENALOG) 10 MG/ML injection 10 mg  10 mg Other Once Asencion IslamStover, Esker Dever, DPM        Allergies  Allergen Reactions  . Morphine     Headache.    Objective:   General:  Alert and oriented x 3, in no acute distress  Dermatology: Skin is warm, dry, and supple bilateral. Nails are within normal limits. There is no lower extremity erythema, no eccymosis, no open lesions present bilateral.   Vascular: Dorsalis Pedis and Posterior Tibial pedal pulses are 2/4 bilateral. + hair growth noted bilateral. Capillary Fill Time is 3 seconds in all digits.  No varicosities, Trace edema bilateral lower extremities.   Neurological: Sensation grossly intact to light touch with an achilles reflex of +2 and a  negative Tinel's sign bilateral. Vibratory, sharp/dull, Semmes Weinstein Monofilament within normal limits.   Musculoskeletal: There is decreased tenderness to palpation at the medial calcaneal tubercale and through the insertion of the plantar fascia on the right foot however, there is more intense pain at the central heel and at the lateral heel at the insertion of the plantar fascia on the right unchanged. No pain with compression to calcaneus or application of tuning fork. There is decreased Ankle joint range of motion bilateral. All other joints range of motion  within normal limits bilateral. Strength 5/5 bilateral.   Assessment and Plan: Problem List Items Addressed This Visit    None    Visit Diagnoses    Plantar fasciitis, right    -  Primary   PVD (peripheral vascular disease) (HCC)       Swelling       Right foot pain       Relevant Medications   lidocaine (LIDODERM) 5 %      -Complete examination performed.  -Previous x-rays reviewed. -Discussed with patient in detail the condition of plantar fasciitis, how this  occurs related to the foot type of the patient and general treatment options. -No re-injection since no improvement -Rx Lidoderm patch  -Replacement fascial brace for the right was given -Rx PT at [facility]  -Continue with stretching, icing, good supportive shoes, inserts daily.  -Discussed  long term care and reocurrence; will closely monitor; if fails to improve will consider other treatment modalities.  -Recommend patient to continue with compression garment of at least 15 mmHg for positive venous reflux test -Patient to return to office in 4-5 weeks for follow up or sooner if problems or questions arise.  Asencion Islam, DPM

## 2016-10-06 ENCOUNTER — Telehealth: Payer: Self-pay | Admitting: *Deleted

## 2016-10-06 DIAGNOSIS — M722 Plantar fascial fibromatosis: Secondary | ICD-10-CM

## 2016-10-06 NOTE — Telephone Encounter (Addendum)
-----   Message from Asencion Islamitorya Stover, [place] sent at 10/05/2016  3:11 PM EDT ----- Regarding: PT and [facility] Send over orders for PT for fasciitis And then let patient know how to call and schedule appt  Strengthening, stability, gait stability, range of motion, dry needling, iontophoresis. 10/06/2016-[place], informed pt [phone] appt line. [facility].

## 2016-11-16 ENCOUNTER — Ambulatory Visit: Payer: BLUE CROSS/BLUE SHIELD | Admitting: Sports Medicine

## 2017-04-20 ENCOUNTER — Other Ambulatory Visit: Payer: Self-pay | Admitting: Obstetrics and Gynecology

## 2017-04-20 DIAGNOSIS — R928 Other abnormal and inconclusive findings on diagnostic imaging of breast: Secondary | ICD-10-CM

## 2017-04-29 ENCOUNTER — Other Ambulatory Visit: Payer: BLUE CROSS/BLUE SHIELD

## 2017-05-09 ENCOUNTER — Ambulatory Visit
Admission: RE | Admit: 2017-05-09 | Discharge: 2017-05-09 | Disposition: A | Discharge status: Home / Self Care | Payer: BLUE CROSS/BLUE SHIELD | Source: Ambulatory Visit | Attending: Obstetrics and Gynecology | Admitting: Obstetrics and Gynecology

## 2017-05-09 DIAGNOSIS — R928 Other abnormal and inconclusive findings on diagnostic imaging of breast: Secondary | ICD-10-CM

## 2017-06-03 HISTORY — PX: ANKLE SURGERY: SHX546

## 2017-08-01 ENCOUNTER — Encounter (HOSPITAL_COMMUNITY): Payer: Self-pay

## 2017-08-01 ENCOUNTER — Emergency Department (HOSPITAL_COMMUNITY): Payer: BLUE CROSS/BLUE SHIELD

## 2017-08-01 ENCOUNTER — Inpatient Hospital Stay (HOSPITAL_COMMUNITY)
Admission: EM | Admit: 2017-08-01 | Discharge: 2017-08-04 | DRG: 176 | Disposition: A | Discharge status: Home / Self Care | Payer: BLUE CROSS/BLUE SHIELD | Attending: Internal Medicine | Admitting: Internal Medicine

## 2017-08-01 DIAGNOSIS — O2230 Deep phlebothrombosis in pregnancy, unspecified trimester: Secondary | ICD-10-CM

## 2017-08-01 DIAGNOSIS — I2602 Saddle embolus of pulmonary artery with acute cor pulmonale: Secondary | ICD-10-CM | POA: Diagnosis present

## 2017-08-01 DIAGNOSIS — D649 Anemia, unspecified: Secondary | ICD-10-CM | POA: Diagnosis present

## 2017-08-01 DIAGNOSIS — I82412 Acute embolism and thrombosis of left femoral vein: Secondary | ICD-10-CM | POA: Diagnosis present

## 2017-08-01 DIAGNOSIS — M79609 Pain in unspecified limb: Secondary | ICD-10-CM | POA: Diagnosis not present

## 2017-08-01 DIAGNOSIS — M7989 Other specified soft tissue disorders: Secondary | ICD-10-CM | POA: Diagnosis not present

## 2017-08-01 DIAGNOSIS — I2692 Saddle embolus of pulmonary artery without acute cor pulmonale: Secondary | ICD-10-CM | POA: Diagnosis present

## 2017-08-01 DIAGNOSIS — Z90710 Acquired absence of both cervix and uterus: Secondary | ICD-10-CM

## 2017-08-01 DIAGNOSIS — Z825 Family history of asthma and other chronic lower respiratory diseases: Secondary | ICD-10-CM | POA: Diagnosis not present

## 2017-08-01 DIAGNOSIS — S82892D Other fracture of left lower leg, subsequent encounter for closed fracture with routine healing: Secondary | ICD-10-CM

## 2017-08-01 DIAGNOSIS — Z833 Family history of diabetes mellitus: Secondary | ICD-10-CM | POA: Diagnosis not present

## 2017-08-01 DIAGNOSIS — Z8249 Family history of ischemic heart disease and other diseases of the circulatory system: Secondary | ICD-10-CM | POA: Diagnosis not present

## 2017-08-01 DIAGNOSIS — Z885 Allergy status to narcotic agent status: Secondary | ICD-10-CM

## 2017-08-01 DIAGNOSIS — I82432 Acute embolism and thrombosis of left popliteal vein: Secondary | ICD-10-CM | POA: Diagnosis present

## 2017-08-01 DIAGNOSIS — S82892A Other fracture of left lower leg, initial encounter for closed fracture: Secondary | ICD-10-CM | POA: Diagnosis present

## 2017-08-01 DIAGNOSIS — Z86711 Personal history of pulmonary embolism: Secondary | ICD-10-CM | POA: Diagnosis not present

## 2017-08-01 DIAGNOSIS — F419 Anxiety disorder, unspecified: Secondary | ICD-10-CM | POA: Diagnosis present

## 2017-08-01 DIAGNOSIS — Z8349 Family history of other endocrine, nutritional and metabolic diseases: Secondary | ICD-10-CM | POA: Diagnosis not present

## 2017-08-01 HISTORY — DX: Unspecified asthma, uncomplicated: J45.909

## 2017-08-01 HISTORY — DX: Deep phlebothrombosis in pregnancy, unspecified trimester: O22.30

## 2017-08-01 LAB — BASIC METABOLIC PANEL
Anion gap: 10 (ref 5–15)
BUN: 11 mg/dL (ref 6–20)
CALCIUM: 8.9 mg/dL (ref 8.9–10.3)
CHLORIDE: 107 mmol/L (ref 101–111)
CO2: 20 mmol/L — AB (ref 22–32)
CREATININE: 1 mg/dL (ref 0.44–1.00)
GFR calc Af Amer: >60 mL/min (ref >60)
GFR calc non Af Amer: >60 mL/min (ref >60)
Glucose, Bld: 109 mg/dL — ABNORMAL HIGH (ref 65–99)
Potassium: 3.8 mmol/L (ref 3.5–5.1)
Sodium: 137 mmol/L (ref 135–145)

## 2017-08-01 LAB — I-STAT TROPONIN, ED: TROPONIN I, POC: 0 ng/mL (ref 0.00–0.08)

## 2017-08-01 LAB — CBC
HCT: 38.7 % (ref 36.0–46.0)
Hemoglobin: 12.5 g/dL (ref 12.0–15.0)
MCH: 29 pg (ref 26.0–34.0)
MCHC: 32.3 g/dL (ref 30.0–36.0)
MCV: 89.8 fL (ref 78.0–100.0)
PLATELETS: 208 10*3/uL (ref 150–400)
RBC: 4.31 MIL/uL (ref 3.87–5.11)
RDW: 13.8 % (ref 11.5–15.5)
WBC: 13.2 10*3/uL — ABNORMAL HIGH (ref 4.0–10.5)

## 2017-08-01 LAB — BRAIN NATRIURETIC PEPTIDE: B Natriuretic Peptide: 37.8 pg/mL (ref 0.0–100.0)

## 2017-08-01 MED ORDER — ONDANSETRON HCL 4 MG PO TABS: 4.0000 mg | ORAL_TABLET | Freq: Four times a day (QID) | ORAL | Status: DC | PRN

## 2017-08-01 MED ORDER — HEPARIN BOLUS VIA INFUSION
5500.0000 [IU] | Freq: Once | INTRAVENOUS | Status: AC
  Administered 2017-08-01: 5500 [IU] via INTRAVENOUS
  Filled 2017-08-01: qty 5500

## 2017-08-01 MED ORDER — SODIUM CHLORIDE 0.9 % IV BOLUS
1000.0000 mL | Freq: Once | INTRAVENOUS | Status: AC
  Administered 2017-08-01: 1000 mL via INTRAVENOUS

## 2017-08-01 MED ORDER — HEPARIN (PORCINE) IN NACL 100-0.45 UNIT/ML-% IJ SOLN
1800.0000 [IU]/h | INTRAMUSCULAR | Status: DC
  Administered 2017-08-01: 1500 [IU]/h via INTRAVENOUS
  Filled 2017-08-01 (×5): qty 250

## 2017-08-01 MED ORDER — FENTANYL CITRATE (PF) 100 MCG/2ML IJ SOLN
25.0000 ug | INTRAMUSCULAR | Status: DC | PRN
  Administered 2017-08-01 – 2017-08-02 (×5): 50 ug via INTRAVENOUS
  Filled 2017-08-01 (×5): qty 2

## 2017-08-01 MED ORDER — ACETAMINOPHEN 500 MG PO TABS: 1000.0000 mg | ORAL_TABLET | Freq: Four times a day (QID) | ORAL | Status: DC | PRN

## 2017-08-01 MED ORDER — FENTANYL CITRATE (PF) 100 MCG/2ML IJ SOLN
INTRAMUSCULAR | Status: AC
  Filled 2017-08-01: qty 2

## 2017-08-01 MED ORDER — IOPAMIDOL (ISOVUE-370) INJECTION 76%
INTRAVENOUS | Status: AC
  Administered 2017-08-01: 100 mL
  Filled 2017-08-01: qty 100

## 2017-08-01 MED ORDER — SODIUM CHLORIDE 0.9 % IV SOLN
INTRAVENOUS | Status: DC
  Administered 2017-08-01: 1000 mL via INTRAVENOUS
  Administered 2017-08-02: 23:00:00 via INTRAVENOUS

## 2017-08-01 MED ORDER — ACETAMINOPHEN 325 MG PO TABS
650.0000 mg | ORAL_TABLET | Freq: Four times a day (QID) | ORAL | Status: DC | PRN
  Administered 2017-08-02: 650 mg via ORAL
  Filled 2017-08-01: qty 2

## 2017-08-01 MED ORDER — FENTANYL CITRATE (PF) 100 MCG/2ML IJ SOLN
100.0000 ug | Freq: Once | INTRAMUSCULAR | Status: AC
  Administered 2017-08-01: 100 ug via INTRAVENOUS

## 2017-08-01 MED ORDER — OXYCODONE-ACETAMINOPHEN 5-325 MG PO TABS
1.0000 | ORAL_TABLET | Freq: Four times a day (QID) | ORAL | Status: DC | PRN
  Administered 2017-08-01 – 2017-08-03 (×6): 1 via ORAL
  Filled 2017-08-01 (×7): qty 1

## 2017-08-01 MED ORDER — ACETAMINOPHEN 650 MG RE SUPP: 650.0000 mg | Freq: Four times a day (QID) | RECTAL | Status: DC | PRN

## 2017-08-01 MED ORDER — ONDANSETRON HCL 4 MG/2ML IJ SOLN: 4.0000 mg | Freq: Four times a day (QID) | INTRAMUSCULAR | Status: DC | PRN

## 2017-08-01 NOTE — ED Provider Notes (Signed)
Patient placed in Quick Look pathway, seen and evaluated   Chief Complaint: pleuritic CP  HPI:   Pt w PMHx PE, anxiety, presenting via EMS with CP that began yesterday that felt like a cramp. States pain subsided until around 1:15pm today and began having chest pain once again. States pain is on the left, worse with breathing. Denies SOB. Pt is s/p ORIF of left ankle on 07/09/17. Reports she has been having increased pain to left calf and distal thigh. Associated this pain to a difficult day at PT. States chest pain feels very similar to prior PE. She is not on anticoagulation. Took Lovenox post-op for a couple of weeks, though was switched to aspirin.  ROS: + chest pain, + calf pain, - SOB  Physical Exam:   Gen: No distress  Neuro: Awake and Alert  Skin: Warm    Focused Exam: Pt with tachypnea with mildly inc work of breathing. Lungs CTAB. Heart sounds normal. No chest tenderness. Left proximal calf, posterior knee and distal thigh with tenderness. No significant edema or erythema noted.  VSS. CTA ordered for concern of PE.   Initiation of care has begun. The patient has been counseled on the process, plan, and necessity for staying for the completion/evaluation, and the remainder of the medical screening examination    Robinson, [place] N, PA-C 08/01/17 1504    Gerhard MunchLockwood, Robert, MD 08/01/17 [phone]

## 2017-08-01 NOTE — ED Provider Notes (Signed)
[facility] EMERGENCY DEPARTMENT Provider Note   CSN: [id number] Arrival date & time: 08/01/17  1435     History   Chief Complaint Chief Complaint  Patient presents with  . Chest Pain    HPI Diane Shaffer is a 47 y.o. female.  The history is provided by the patient.  Chest Pain   This is a new problem. The current episode started yesterday. The problem occurs constantly. The problem has been gradually worsening. The pain is present in the lateral region. The pain is moderate. The quality of the pain is described as pleuritic. Duration of episode(s) is 2 days. Pertinent negatives include no abdominal pain, no back pain, no cough, no diaphoresis, no exertional chest pressure, no fever, no nausea, no palpitations, no shortness of breath, no syncope and no vomiting.  Pertinent negatives for past medical history include no seizures.  -Patient has history of PE over 10 years ago not currently anticoagulated who presents with left-sided pleuritic chest pain and initially tachycardic in the 130s.  She got fentanyl with EMS which improved her pain resolved her tachycardia.  She states the pain started yesterday and has since worsened.  She denies feeling short of breath.  She did recently break her ankle and had a orthopedic procedure 2 weeks ago.  Past Medical History:  Diagnosis Date  . Anxiety   . Asthma   . Depression   . Pulmonary embolism (HCC) 2008   released by MD, result of birth control    Patient Active Problem List   Diagnosis Date Noted  . Numbness and tingling 05/10/2014  . Physical exam, annual 11/30/2011  . Tremor of unknown origin 11/30/2011  . Cold intolerance 11/30/2011  . LACERATION 11/21/2008  . LEG CRAMPS, NOCTURNAL 04/19/2008  . DIZZINESS 04/19/2008  . PULMONARY EMBOLISM, HX OF 04/19/2008  . EMBOLISM/INFARCTION, PULMONARY NEC 05/11/2006    Past Surgical History:  Procedure Laterality Date  . ABDOMINAL HYSTERECTOMY    . LAPAROSCOPIC  ASSISTED VAGINAL HYSTERECTOMY  03/16/2012   Procedure: LAPAROSCOPIC ASSISTED VAGINAL HYSTERECTOMY;  Surgeon: Zelphia Cairo, MD;  Location: [facility];  Service: Gynecology;  Laterality: N/A;  . TUBAL LIGATION       OB History   None      Home Medications    Prior to Admission medications   Medication Sig Start Date End Date Taking? Authorizing Provider  acetaminophen (TYLENOL) 500 MG tablet Take 1,000 mg by mouth every 6 (six) hours as needed.   Yes [provider]  oxyCODONE-acetaminophen (PERCOCET/ROXICET) 5-325 MG tablet Take 1 tablet by mouth every 6 (six) hours as needed. 07/05/17  Yes [provider]  lidocaine (LIDODERM) 5 % Place 1 patch onto the skin daily. Remove & Discard patch within 12 hours or as directed by MD Patient not taking: Reported on 08/01/2017 10/05/16   Asencion Islam, DPM    Family History Family History  Problem Relation Age of Onset  . COPD Mother   . Healthy Father   . Diabetes Maternal Uncle   . Heart disease Maternal Grandfather   . Thyroid disease Sister   . Healthy Brother     Social History Social History   Tobacco Use  . Smoking status: Never Smoker  . Smokeless tobacco: Never Used  Substance Use Topics  . Alcohol use: Yes    Alcohol/week: 1.8 oz    Types: 3 Glasses of wine per week    Comment: socially  . Drug use: No     Allergies  Morphine   Review of Systems Review of Systems  Constitutional: Negative for chills, diaphoresis and fever.  HENT: Negative for ear pain and sore throat.   Eyes: Negative for pain and visual disturbance.  Respiratory: Negative for cough and shortness of breath.   Cardiovascular: Positive for chest pain. Negative for palpitations and syncope.  Gastrointestinal: Negative for abdominal pain, nausea and vomiting.  Genitourinary: Negative for dysuria and hematuria.  Musculoskeletal: Negative for back pain and neck pain.  Skin: Negative for rash.  Neurological: Negative for seizures,  syncope and light-headedness.  All other systems reviewed and are negative.    Physical Exam Updated Vital Signs BP 135/84   Pulse 89   Temp 98.4 F (36.9 C) (Oral)   Resp 14   Ht 5\' 8"  (1.727 m)   Wt 104.3 kg (230 lb)   SpO2 97%   BMI 34.97 kg/m   Physical Exam  Constitutional: She appears well-developed and well-nourished. No distress.  HENT:  Head: Normocephalic and atraumatic.  Eyes: Conjunctivae are normal.  Neck: Neck supple.  Cardiovascular: Normal rate, regular rhythm, intact distal pulses and normal pulses.  No murmur heard. Pulmonary/Chest: Effort normal and breath sounds normal. No respiratory distress. She has no decreased breath sounds. She has no wheezes. She has no rhonchi. She has no rales.  Abdominal: Soft. There is no tenderness.  Musculoskeletal: She exhibits no edema.  Neurological: She is alert.  Skin: Skin is warm and dry.  Psychiatric: She has a normal mood and affect.  Nursing note and vitals reviewed.    ED Treatments / Results  Labs (all labs ordered are listed, but only abnormal results are displayed) Labs Reviewed  BASIC METABOLIC PANEL - Abnormal; Notable for the following components:      Result Value   CO2 20 (*)    Glucose, Bld 109 (*)    All other components within normal limits  CBC - Abnormal; Notable for the following components:   WBC 13.2 (*)    All other components within normal limits  BRAIN NATRIURETIC PEPTIDE  HEPARIN LEVEL (UNFRACTIONATED)  CBC  I-STAT TROPONIN, ED    EKG None  Radiology Ct Angio Chest Pe W/cm &/or Wo Cm  Result Date: 08/01/2017 CLINICAL DATA:  Chest pain radiating to the left breast. History of PE. EXAM: CT ANGIOGRAPHY CHEST WITH CONTRAST TECHNIQUE: Multidetector CT imaging of the chest was performed using the standard protocol during bolus administration of intravenous contrast. Multiplanar CT image reconstructions and MIPs were obtained to evaluate the vascular anatomy. CONTRAST:  100mL  ISOVUE-370 IOPAMIDOL (ISOVUE-370) INJECTION 76% COMPARISON:  Chest radiograph 06/28/2017 FINDINGS: Cardiovascular: Satisfactory opacification of the pulmonary arteries to segmental level. There is a central pulmonary embolus in the posterior aspect of bilateral main pulmonary arteries. The thrombus extends into all lobar branches, and is nearly occlusive in the left lower and right lower lobar arterial branches. Extension of thrombus is seen within segmental branches in bilateral lower lobes, lingula, right middle lobe. Evidence of right heart strain with RV/LV ratio of 1.2. Mediastinum/Nodes: No enlarged mediastinal, hilar, or axillary lymph nodes. Thyroid gland, and trachea demonstrate no significant findings. Small hiatal hernia. Lungs/Pleura: Small left pleural effusion. Atelectatic changes in the left lower lobe and lingula. Mild mosaic attenuation of the lung parenchyma, likely due to perfusion abnormalities. Upper Abdomen: No acute abnormality. Musculoskeletal: No chest wall abnormality. No acute or significant osseous findings. Review of the MIP images confirms the above findings. IMPRESSION: Bilateral central pulmonary embolus extending to all lobar  and most of segmental branches of bilateral lungs. Near occlusive in bilateral lower lobe pulmonary arterial branches. Evidence of right heart strain with RV/LV ratio equals 1.2 Small left pleural effusion. Mild mosaic attenuation of the lung parenchyma, likely due to perfusion abnormalities. Critical Value/emergent results were called by telephone at the time of interpretation on 08/01/2017 at 5:16 pm to Dr. Roger Shelter , who verbally acknowledged these results. Small hiatal hernia. Electronically Signed   By: Ted Mcalpine M.D.   On: 08/01/2017 17:20    Procedures [place] bedside Date/Time: 08/01/2017 5:48 PM Performed by: Dwana Melena, DO Authorized by: Abelino Derrick, MD  Consent: Verbal consent obtained. Consent given by: patient Patient tolerance:  Patient tolerated the procedure well with no immediate complications Comments: Bedside echocardiogram performed for saddle pulmonary embolus to evaluate for right heart strain.  Views obtained included parasternal long, parasternal short, subcostal, apical four-chamber, IVC.  Findings showed grossly normal EF of the LV, dilated right RV, dilated and minimally collapsible IVC.  No bowing of the intraventricular septum.  No tap in/pericardial effusion noted.    (including critical care time)  Medications Ordered in ED Medications  fentaNYL (SUBLIMAZE) 100 MCG/2ML injection (has no administration in time range)  heparin ADULT infusion 100 units/mL (25000 units/237mL sodium chloride 0.45%) (1,500 Units/hr Intravenous New Bag/Given 08/01/17 1741)  sodium chloride 0.9 % bolus 1,000 mL (has no administration in time range)  fentaNYL (SUBLIMAZE) injection 100 mcg (100 mcg Intravenous Given 08/01/17 1615)  iopamidol (ISOVUE-370) 76 % injection (100 mLs  Contrast Given 08/01/17 1635)  heparin bolus via infusion 5,500 Units (5,500 Units Intravenous Bolus from Bag 08/01/17 1744)     Initial Impression / Assessment and Plan / ED Course  I have reviewed the triage vital signs and the nursing notes.  Pertinent labs & imaging results that were available during my care of the patient were reviewed by me and considered in my medical decision making (see chart for details).     Patient is a 47 year old female with history of anxiety, depression, asthma, PE who presents with left-sided pleuritic chest pain for the last 2 days.  She did have a recent orthopedic procedure.  EKG shows sinus rhythm with no signs of ischemia.  Troponin is negative.  CTA PE performed which shows a saddle embolus with large clot burden in size right heart strain.  Bedside echo shows signs of right heart strain as well with dilation of the RV and IVC dilatation and minimal collapsibility.  Patient's vital signs are stable and she is  normotensive and currently not tachycardic.  She is satting 98% on room air with unlabored breathing.    Patient started on heparin drip.  Patient will be admitted to medicine in the stepdown unit for further monitoring and treatment.  Final Clinical Impressions(s) / ED Diagnoses   Final diagnoses:  Acute saddle pulmonary embolism with acute cor pulmonale [facility])    ED Discharge Orders    None       Dwana Melena, DO 08/01/17 1751    Abelino Derrick, MD 08/04/17 [phone]

## 2017-08-01 NOTE — Progress Notes (Signed)
ANTICOAGULATION CONSULT NOTE - Initial Consult  Pharmacy Consult for heparin Indication: pulmonary embolus  Allergies  Allergen Reactions  . Morphine     Headache.   Patient Measurements: Height: 5\' 8"  (172.7 cm) Weight: 230 lb (104.3 kg) IBW/kg (Calculated) : 63.9 Heparin Dosing Weight: 87 Kg  Vital Signs: Temp: 98.4 F (36.9 C) (04/01 1621) Temp Source: Oral (04/01 1621) BP: 131/82 (04/01 1715) Pulse Rate: 89 (04/01 1715)  Labs: Recent Labs    08/01/17 1455  HGB 12.5  HCT 38.7  PLT 208  CREATININE 1.00   Estimated Creatinine Clearance: 86.1 mL/min (by C-G formula based on SCr of 1 mg/dL).  Medical History: Past Medical History:  Diagnosis Date  . Anxiety   . Asthma   . Depression   . Pulmonary embolism (HCC) 2008   released by MD, result of birth control   Assessment: [age] yoF presenting with chest pain found to have bilateral PE with right heart strain. No anticoagulation PTA; CBC WNL. Pharmacy to start heparin gtt.  Goal of Therapy:  Heparin level 0.3-0.7 units/ml Monitor platelets by anticoagulation protocol: Yes   Plan:  Give 5500 units bolus x 1 Start heparin infusion at 1500 units/hr Check anti-Xa level in 6 hours and daily while on heparin Continue to monitor H&H and platelets  Zalaya Astarita L Gerturde Kuba 08/01/2017,5:27 PM

## 2017-08-01 NOTE — ED Notes (Signed)
Pt requested her IV be placed in right hand because she would like to be able to text with out causing the pump to go off-Moniquje,RN

## 2017-08-01 NOTE — ED Notes (Signed)
ORDERED HH TRAY 

## 2017-08-01 NOTE — ED Triage Notes (Signed)
PT presents to ed via EMS. Pt was driving from the store when she began having cp and attempted to [facility]. Pain became too severe and she pulled over and called EMS. Substernal radiates to left breast and and up neck. Pt reports pain is similar to when she had PE. Pt had orthopedic ankle sx 2 weeks ago. SOB improved with pain medication  324mg  ASA, 2 SL NTG, 100mcg fentanyl PTA.  #18 LFA Hr initially was 130 ST but came down to 98 after pain medication.

## 2017-08-01 NOTE — H&P (Signed)
History and Physical        Hospital Admission Note Date: 08/01/2017  Patient name: Diane Shaffer Medical record number: [id number] Date of birth: 1970/09/09 Age: 47 y.o. Gender: female  PCP: Patient, No Pcp Per    Patient coming from: Home  I have reviewed all records in the [facility].    Chief Complaint:  Left-sided chest pain for last 2 days  HPI: Patient is a 47 year old female with history of prior pulmonary embolism 10 years ago due to birth control pills, anxiety presented to ED with left-sided chest pain.  Patient reported that she had a left ankle fracture due to tripping in mud and had ORIF surgery on July 09, 2017 at [facility].  She was placed on Lovenox for DVT prophylaxis however she quit after 5 days.  She was then placed on aspirin 325 mg twice a day by her orthopedics.  Patient reported that on Friday 3 days ago she worked with physical therapy and felt pain in her left leg.  Per patient, she noticed left-sided chest pain yesterday, initially felt like a cramp.  However today she was driving from work when she felt acute chest pain, radiating to her neck, 10 out of 10 in intensity, sharp, worse with breathing.  ED work-up/course:  In ED, CT angiogram of the chest showed bilateral central pulmonary embolus extending to all lobar and most of segmental branches of bilateral lungs, near occlusive and bilateral lower lobe pulmonary arterial branches with evidence of right heart strain.  Temp 98.3, pulse 93, BP 122/85, O2 sats 99% on room air BMET unremarkable, WBC 13.2, hemoglobin 12.5, platelets 208 EKG showed rate 93, normal sinus rhythm, no acute ST-T wave changes of ischemia  Review of Systems: Positives marked in 'bold' Constitutional: Denies fever, chills, diaphoresis, poor appetite and fatigue.  HEENT: Denies photophobia, eye pain, redness, hearing  loss, ear pain, congestion, sore throat, rhinorrhea, sneezing, mouth sores, trouble swallowing, neck pain, neck stiffness and tinnitus.   Respiratory: Please see HPI Cardiovascular: Please see HPI Gastrointestinal: Denies nausea, vomiting, abdominal pain, diarrhea, constipation, blood in stool and abdominal distention.  Genitourinary: Denies dysuria, urgency, frequency, hematuria, flank pain and difficulty urinating.  Musculoskeletal: Denies myalgias, back pain, joint swelling,, + difficulty with gait due to recent left ankle fracture Skin: Denies pallor, rash and wound.  Neurological: Denies dizziness, seizures, syncope, weakness, light-headedness, numbness and headaches.  Hematological: Denies adenopathy. Easy bruising, personal or family bleeding history  Psychiatric/Behavioral: Denies suicidal ideation, mood changes, confusion, nervousness, sleep disturbance and agitation  Past Medical History: Past Medical History:  Diagnosis Date  . Anxiety   . Asthma   . Depression   . Pulmonary embolism (HCC) 2008   released by MD, result of birth control    Past Surgical History:  Procedure Laterality Date  . ABDOMINAL HYSTERECTOMY    . LAPAROSCOPIC ASSISTED VAGINAL HYSTERECTOMY  03/16/2012   Procedure: LAPAROSCOPIC ASSISTED VAGINAL HYSTERECTOMY;  Surgeon: Zelphia Cairo, MD;  Location: [facility];  Service: Gynecology;  Laterality: N/A;  . TUBAL LIGATION      Medications: Prior to Admission medications   Medication Sig Start Date End Date Taking? Authorizing Provider  oxyCODONE-acetaminophen (PERCOCET/ROXICET)  5-325 MG tablet Take 1 tablet by mouth every 6 (six) hours as needed. 07/05/17  Yes [provider]  acetaminophen (TYLENOL) 500 MG tablet Take 1,000 mg by mouth every 6 (six) hours as needed.    [provider]  lidocaine (LIDODERM) 5 % Place 1 patch onto the skin daily. Remove & Discard patch within 12 hours or as directed by MD Patient not taking: Reported on  08/01/2017 10/05/16   Asencion Islam, DPM    Allergies:   Allergies  Allergen Reactions  . Morphine     Headache.    Social History:  reports that she has never smoked. She has never used smokeless tobacco. She reports that she drinks about 1.8 oz of alcohol per week. She reports that she does not use drugs.  Family History: Family History  Problem Relation Age of Onset  . COPD Mother   . Healthy Father   . Diabetes Maternal Uncle   . Heart disease Maternal Grandfather   . Thyroid disease Sister   . Healthy Brother     Physical Exam: Blood pressure 136/85, pulse 88, temperature 98.4 F (36.9 C), temperature source Oral, resp. rate (!) 23, height 5\' 8"  (1.727 m), weight 104.3 kg (230 lb), SpO2 99 %. General: Alert, awake, oriented x3, in no acute distress. Eyes: pink conjunctiva,anicteric sclera, pupils equal and reactive to light and accomodation, HEENT: normocephalic, atraumatic, oropharynx clear Neck: supple, no masses or lymphadenopathy, no goiter, no bruits, no JVD CVS: Regular rate and rhythm, without murmurs, rubs or gallops. No lower extremity edema Resp : Clear to auscultation bilaterally, no wheezing, rales or rhonchi. GI : Soft, nontender, nondistended, positive bowel sounds, no masses. No hepatomegaly. No hernia.  Musculoskeletal: No clubbing or cyanosis, positive pedal pulses in RLE. LLE in the ortho boot.  Neuro: Grossly intact, no focal neurological deficits, strength 5/5 upper and lower extremities bilaterally Psych: alert and oriented x 3, normal mood and affect Skin: no rashes or lesions, warm and dry   LABS on Admission: I have personally reviewed all the labs and imagings below    Basic Metabolic Panel: Recent Labs  Lab 08/01/17 1455  NA 137  K 3.8  CL 107  CO2 20*  GLUCOSE 109*  BUN 11  CREATININE 1.00  CALCIUM 8.9   Liver Function Tests: No results for input(s): AST, ALT, ALKPHOS, BILITOT, PROT, ALBUMIN in the last 168 hours. No results for  input(s): LIPASE, AMYLASE in the last 168 hours. No results for input(s): AMMONIA in the last 168 hours. CBC: Recent Labs  Lab 08/01/17 1455  WBC 13.2*  HGB 12.5  HCT 38.7  MCV 89.8  PLT 208   Cardiac Enzymes: No results for input(s): CKTOTAL, CKMB, CKMBINDEX, TROPONINI in the last 168 hours. BNP: Invalid input(s): POCBNP CBG: No results for input(s): GLUCAP in the last 168 hours.  Radiological Exams on Admission:  Ct Angio Chest Pe W/cm &/or Wo Cm  Result Date: 08/01/2017 CLINICAL DATA:  Chest pain radiating to the left breast. History of PE. EXAM: CT ANGIOGRAPHY CHEST WITH CONTRAST TECHNIQUE: Multidetector CT imaging of the chest was performed using the standard protocol during bolus administration of intravenous contrast. Multiplanar CT image reconstructions and MIPs were obtained to evaluate the vascular anatomy. CONTRAST:  ISOVUE-370 IOPAMIDOL (ISOVUE-370) INJECTION 76% COMPARISON:  Chest radiograph 06/28/2017 FINDINGS: Cardiovascular: Satisfactory opacification of the pulmonary arteries to segmental level. There is a central pulmonary embolus in the posterior aspect of bilateral main pulmonary arteries. The thrombus extends  into all lobar branches, and is nearly occlusive in the left lower and right lower lobar arterial branches. Extension of thrombus is seen within segmental branches in bilateral lower lobes, lingula, right middle lobe. Evidence of right heart strain with RV/LV ratio of 1.2. Mediastinum/Nodes: No enlarged mediastinal, hilar, or axillary lymph nodes. Thyroid gland, and trachea demonstrate no significant findings. Small hiatal hernia. Lungs/Pleura: Small left pleural effusion. Atelectatic changes in the left lower lobe and lingula. Mild mosaic attenuation of the lung parenchyma, likely due to perfusion abnormalities. Upper Abdomen: No acute abnormality. Musculoskeletal: No chest wall abnormality. No acute or significant osseous findings. Review of the MIP images  confirms the above findings. IMPRESSION: Bilateral central pulmonary embolus extending to all lobar and most of segmental branches of bilateral lungs. Near occlusive in bilateral lower lobe pulmonary arterial branches. Evidence of right heart strain with RV/LV ratio equals 1.2 Small left pleural effusion. Mild mosaic attenuation of the lung parenchyma, likely due to perfusion abnormalities. Critical Value/emergent results were called by telephone at the time of interpretation on 08/01/2017 at 5:16 pm to Dr. Roger Shelterong , who verbally acknowledged these results. Small hiatal hernia. Electronically Signed   By: Ted Mcalpineobrinka  Dimitrova M.D.   On: 08/01/2017 17:20      EKG: Independently reviewed.  Rate 93, normal sinus rhythm, no acute ST-T wave changes suggestive of ischemia   Assessment/Plan Principal Problem:  Acute Saddle pulmonary embolus [facility](HCC): Provoked secondary to left ankle orthopedic surgery and limited mobility however this is patient's second episode.  Prior episode 10 years ago. -CT angiogram of the chest showed bilateral central embolus extending to all the lobar and segmental branches of bilateral lungs with right heart strain -Placed on IV heparin drip, currently hemodynamically stable, no hypoxia -Obtain 2D echo, lower extremity venous Dopplers -Low threshold for transferring to ICU if patient develops any hemodynamic instability, will then need pulmonary consult, IR for thrombectomy.  Currently, VS stable, no hypoxia, tachycardia or tachypnea.  Active Problems:   Closed left ankle fracture -patient underwent ORIF on March 19th, will have PT eval after 24 hours of IV heparin drip -Bedrest for now  Anxiety -Currently stable, no acute issues  DVT prophylaxis: On IV heparin drip  CODE STATUS: Full code  Consults called: None  Family Communication: Admission, patients condition and plan of care including tests being ordered have been discussed with the patient and husband  who indicates  understanding and agree with the plan and Code Status  Admission status:   Disposition plan: Further plan will depend as patient's clinical course evolves and further radiologic and laboratory data become available.    At the time of admission, it appears that the appropriate admission status for this patient is INPATIENT . This is judged to be reasonable and necessary in order to provide the required intensity of service to ensure the patient's safety given the presenting symptoms left-sided chest pain and massive saddle PE, physical exam findings, and initial radiographic and laboratory data in the context of their chronic comorbidities.  The medical decision making on this patient was of high complexity and the patient is at high risk for clinical deterioration, therefore this is a level 3 visit.   Time Spent on Admission: 60mins     Ripudeep Rai M.D. [facility] 08/01/2017, 6:16 PM Pager: [phone]  If 7PM-7AM, please contact night-coverage [email or web address] Password [email or web address]

## 2017-08-02 ENCOUNTER — Inpatient Hospital Stay (HOSPITAL_COMMUNITY)
Admit: 2017-08-02 | Discharge: 2017-08-02 | Disposition: A | Discharge status: Home / Self Care | Payer: BLUE CROSS/BLUE SHIELD | Attending: Internal Medicine | Admitting: Internal Medicine

## 2017-08-02 ENCOUNTER — Inpatient Hospital Stay (HOSPITAL_COMMUNITY): Payer: BLUE CROSS/BLUE SHIELD

## 2017-08-02 DIAGNOSIS — M79609 Pain in unspecified limb: Secondary | ICD-10-CM

## 2017-08-02 DIAGNOSIS — M7989 Other specified soft tissue disorders: Secondary | ICD-10-CM

## 2017-08-02 LAB — CBC
HEMATOCRIT: 34.8 % — AB (ref 36.0–46.0)
Hemoglobin: 11.2 g/dL — ABNORMAL LOW (ref 12.0–15.0)
MCH: 29 pg (ref 26.0–34.0)
MCHC: 32.2 g/dL (ref 30.0–36.0)
MCV: 90.2 fL (ref 78.0–100.0)
PLATELETS: 177 10*3/uL (ref 150–400)
RBC: 3.86 MIL/uL — ABNORMAL LOW (ref 3.87–5.11)
RDW: 14.1 % (ref 11.5–15.5)
WBC: 10.7 10*3/uL — ABNORMAL HIGH (ref 4.0–10.5)

## 2017-08-02 LAB — BASIC METABOLIC PANEL
Anion gap: 7 (ref 5–15)
BUN: 9 mg/dL (ref 6–20)
CO2: 20 mmol/L — AB (ref 22–32)
CREATININE: 0.64 mg/dL (ref 0.44–1.00)
Calcium: 8.3 mg/dL — ABNORMAL LOW (ref 8.9–10.3)
Chloride: 110 mmol/L (ref 101–111)
GFR calc Af Amer: >60 mL/min (ref >60)
GFR calc non Af Amer: >60 mL/min (ref >60)
Glucose, Bld: 99 mg/dL (ref 65–99)
POTASSIUM: 3.7 mmol/L (ref 3.5–5.1)
Sodium: 137 mmol/L (ref 135–145)

## 2017-08-02 LAB — HIV ANTIBODY (ROUTINE TESTING W REFLEX): HIV Screen 4th Generation wRfx: NONREACTIVE

## 2017-08-02 LAB — HEPARIN LEVEL (UNFRACTIONATED)
HEPARIN UNFRACTIONATED: 0.2 [IU]/mL — AB (ref 0.30–0.70)
Heparin Unfractionated: 0.55 [IU]/mL (ref 0.30–0.70)
Heparin Unfractionated: 0.55 [IU]/mL (ref 0.30–0.70)

## 2017-08-02 MED ORDER — HEPARIN BOLUS VIA INFUSION
2000.0000 [IU] | Freq: Once | INTRAVENOUS | Status: AC
  Administered 2017-08-02: 2000 [IU] via INTRAVENOUS
  Filled 2017-08-02: qty 2000

## 2017-08-02 MED ORDER — HEPARIN BOLUS VIA INFUSION
4000.0000 [IU] | Freq: Once | INTRAVENOUS | Status: AC
  Administered 2017-08-02: 4000 [IU] via INTRAVENOUS
  Filled 2017-08-02: qty 4000

## 2017-08-02 NOTE — Progress Notes (Signed)
ANTICOAGULATION CONSULT NOTE  Pharmacy Consult for heparin Indication: pulmonary embolus  Allergies  Allergen Reactions  . Morphine     Headache.   Patient Measurements: Height: 5\' 8"  (172.7 cm) Weight: 230 lb (104.3 kg) IBW/kg (Calculated) : 63.9 Heparin Dosing Weight: 87 Kg  Vital Signs: BP: 106/61 (04/02 1300) Pulse Rate: 68 (04/02 1200)  Labs: Recent Labs    08/01/17 1455 08/02/17 0014 08/02/17 0918 08/02/17 1203  HGB 12.5  --  11.2*  --   HCT 38.7  --  34.8*  --   PLT 208  --  177  --   HEPARINUNFRC  --  0.20*  --  0.55  CREATININE 1.00  --  0.64  --    Estimated Creatinine Clearance: 107.6 mL/min (by C-G formula based on SCr of 0.64 mg/dL).   Assessment:  [age] yoF presenting with chest pain found to have bilateral PE with right heart strain. No anticoagulation PTA.  Initial level was low, and then infusion was off briefly overnight, so patient was given two separate boluses and infusion was increased with low level. Now level within range at 0.55 units/mL. No bleeding noted, CBC overall stable.  Goal of Therapy:  Heparin level 0.3-0.7 units/ml Monitor platelets by anticoagulation protocol: Yes   Plan:  Continue heparin infusion at 1800 units/hr Recheck heparin level tonight to ensure level stays within range given multiple boluses Daily heparin level and CBC Follow for long term anticoagulation plans  Vennie Salsbury D. Jeter Tomey, PharmD, BCPS Clinical Pharmacist Clinical Phone for 08/02/2017 until 3:30pm: [phone] If after 3:30pm, please call main pharmacy at [phone] 08/02/2017 1:41 PM

## 2017-08-02 NOTE — Progress Notes (Signed)
ANTICOAGULATION CONSULT NOTE  Pharmacy Consult for heparin Indication: pulmonary embolus  Allergies  Allergen Reactions  . Morphine     Headache.   Patient Measurements: Height: 5\' 8"  (172.7 cm) Weight: 230 lb (104.3 kg) IBW/kg (Calculated) : 63.9 Heparin Dosing Weight: 87 Kg  Vital Signs: Temp: 98.2 F (36.8 C) (04/02 2028) Temp Source: Oral (04/02 2028) BP: 109/66 (04/02 2028) Pulse Rate: 70 (04/02 2028)  Labs: Recent Labs    08/01/17 1455 08/02/17 0014 08/02/17 0918 08/02/17 1203 08/02/17 2003  HGB 12.5  --  11.2*  --   --   HCT 38.7  --  34.8*  --   --   PLT 208  --  177  --   --   HEPARINUNFRC  --  0.20*  --  0.55 0.55  CREATININE 1.00  --  0.64  --   --    Estimated Creatinine Clearance: 107.6 mL/min (by C-G formula based on SCr of 0.64 mg/dL).   Assessment:  [age] yoF presenting with chest pain found to have bilateral PE with right heart strain. No anticoagulation PTA.  Heparin level remains therapeutic at 0.55 on 1800 units/hr.   Goal of Therapy:  Heparin level 0.3-0.7 units/ml Monitor platelets by anticoagulation protocol: Yes   Plan:  Continue heparin infusion at 1800 units/hr Daily heparin level and CBC Follow for long term anticoagulation plans  Link SnufferJessica Belmira Daley, PharmD, BCPS, BCCCP Clinical Pharmacist Clinical phone 08/02/2017 until 11PM - [phone] After hours, please call [phone] 08/02/2017 8:39 PM

## 2017-08-02 NOTE — Progress Notes (Signed)
ANTICOAGULATION CONSULT NOTE Pharmacy Consult for heparin Indication: pulmonary embolus  Allergies  Allergen Reactions  . Morphine     Headache.   Patient Measurements: Height: 5\' 8"  (172.7 cm) Weight: 230 lb (104.3 kg) IBW/kg (Calculated) : 63.9 Heparin Dosing Weight: 87 Kg  Vital Signs: Temp: 98.4 F (36.9 C) (04/01 1621) Temp Source: Oral (04/01 1621) BP: 116/75 (04/02 0100) Pulse Rate: 76 (04/02 0100)  Labs: Recent Labs    08/01/17 1455 08/02/17 0014  HGB 12.5  --   HCT 38.7  --   PLT 208  --   HEPARINUNFRC  --  0.20*  CREATININE 1.00  --    Estimated Creatinine Clearance: 86.1 mL/min (by C-G formula based on SCr of 1 mg/dL).  Assessment:  47 y.o. female with PE for heparin  Goal of Therapy:  Heparin level 0.3-0.7 units/ml Monitor platelets by anticoagulation protocol: Yes   Plan:  Heparin 2000 units IV bolus, then increase heparin 1800 units/hr Check heparin level in 6 hours.   Galit Urich, Gary FleetGregory Vernon 08/02/2017,1:25 AM

## 2017-08-02 NOTE — Progress Notes (Signed)
Preliminary results by tech - Venous Duplex Lower Ext. Completed. Right leg, negative for deep and superficial vein thrombosis. Left leg, positive for acute deep vein thrombosis involving the common femoral, femoral, popliteal and calf veins.  Bilateral iliac veins are patent without thrombus. IVC not clearly visualized, Results given to Magda PaganiniAudrey, patient's nurse. Marilynne Halstedita Sabena Winner, BS, RDMS, RVT

## 2017-08-02 NOTE — Progress Notes (Signed)
[facility]                                                                              Patient Demographics  Diane Shaffer, is a 47 y.o. female, DOB - 07/17/1970, ZOX:[id number]  Admit date - 08/01/2017   Admitting Physician No admitting provider for patient encounter.  Outpatient Primary MD for the patient is Patient, No Pcp Per  Outpatient specialists:   LOS - 1  days   Medical records reviewed and are as summarized below:    Chief Complaint  Patient presents with  . Chest Pain       Brief summary   Patient is a 47 year old female with history of prior pulmonary embolism 10 years ago due to birth control pills, anxiety presented to ED with left-sided chest pain.  Patient reported that she had a left ankle fracture due to tripping in mud and had ORIF surgery on July 09, 2017 at [place].  She was placed on Lovenox for DVT prophylaxis however she quit after 5 days.  She was then placed on aspirin 325 mg twice a day by her orthopedics.  Patient reported that on Friday 3 days ago she worked with physical therapy and felt pain in her left leg.  Per patient, she noticed left-sided chest pain yesterday, initially felt like a cramp.  However today she was driving from work when she felt acute chest pain, radiating to her neck, 10 out of 10 in intensity, sharp, worse with breathing. CT angiogram of the chest showed bilateral central pulmonary embolus extending to all lobar and most of segmental branches of bilateral lungs, near occlusive and bilateral lower lobe pulmonary arterial branches with evidence of right heart strain    Assessment & Plan    Principal Problem:  Acute Saddle pulmonary embolus (HCC), acute DVT LLE: Provoked secondary to left ankle orthopedic surgery and limited mobility however this is patient's second episode.  Prior episode 10 years ago. -CT angiogram of the chest showed bilateral central embolus extending to all the lobar and segmental  branches of bilateral lungs with right heart strain -Placed on IV heparin drip, currently hemodynamically stable, no hypoxia -Lower extremity Dopplers showed acute DVT in the common femoral femoral popliteal and calf veins of left leg -Discussed with PCCM on call, Dr Michae Kava, no need for catheter directed thrombolysis as patient is hemodynamically stable -Continue IV heparin drip, will place case management consult for NOAC.  Discussed risks and benefits of Coumadin s NOACs, patient prefers NOAC's  -Complaining of significant pleuritic left-sided chest pain, continue pain control with, incentive spirometry -2D echo pending  Active Problems:   Closed left ankle fracture -patient underwent ORIF on March 19th, will have PT eval after 24 hours of IV heparin drip  Anxiety -Currently stable, no acute issues  Code Status: Full code DVT Prophylaxis: IV heparin drip Family Communication: Discussed in detail with the patient, all imaging results, lab results explained to the patient    Disposition Plan: Still waiting SDUbed, case management consult  Time Spent in minutes  25 minutes  Procedures:  Venous Duplex Lower Ext. Right leg, negative for deep and superficial  vein thrombosis. Left leg, positive for acute deep vein thrombosis involving the common femoral, femoral, popliteal and calf veins.  Bilateral iliac veins are patent without thrombus. IVC not clearly visualized,  Consultants:   Discussed with on-call CCM on phone  Antimicrobials:      Medications  Scheduled Meds: Continuous Infusions: . sodium chloride 1,000 mL (08/01/17 2018)  . heparin 1,800 Units/hr (08/02/17 0530)   PRN Meds:.acetaminophen **OR** acetaminophen, acetaminophen, fentaNYL (SUBLIMAZE) injection, ondansetron **OR** ondansetron (ZOFRAN) IV, oxyCODONE-acetaminophen   Antibiotics   Anti-infectives (From admission, onward)   None        Subjective:   Diane Shaffer was seen and examined today.   Complaining of pleuritic intermittent chest pain 10/10 with difficulty catching breath otherwise no hypoxia, vital signs stable. Patient denies dizziness, abdominal pain, N/V/D/C, new weakness, numbess, tingling. No acute events overnight.    Objective:   Vitals:   08/02/17 0800 08/02/17 1100 08/02/17 1130 08/02/17 1200  BP: 108/77  116/69 (!) 112/59  Pulse: 60   68  Resp: 13  (!) 9 12  Temp:      TempSrc:      SpO2: 98% 99%  99%  Weight:      Height:        Intake/Output Summary (Last 24 hours) at 08/02/2017 1248 Last data filed at 08/02/2017 0105 Gross per 24 hour  Intake 1000 ml  Output 1000 ml  Net 0 ml     Wt Readings from Last 3 Encounters:  08/01/17 104.3 kg (230 lb)  08/19/14 95.7 kg (211 lb)  08/02/14 95.9 kg (211 lb 8 oz)     Exam  General: Alert and oriented x 3, NAD  Eyes:   HEENT:  Atraumatic, normocephalic  Cardiovascular: S1 S2 auscultated, Regular rate and rhythm.  Respiratory: Clear to auscultation bilaterally, no wheezing, rales or rhonchi  Gastrointestinal: Soft, nontender, nondistended, + bowel sounds  Ext: no pedal edema bilaterally  Neuro: no new deficits  Musculoskeletal: No digital cyanosis, clubbing  Skin: No rashes  Psych: Normal affect and demeanor, alert and oriented x3    Data Reviewed:  I have personally reviewed following labs and imaging studies  Micro Results No results found for this or any previous visit (from the past 240 hour(s)).  Radiology Reports Ct Angio Chest Pe W/cm &/or Wo Cm  Result Date: 08/01/2017 CLINICAL DATA:  Chest pain radiating to the left breast. History of PE. EXAM: CT ANGIOGRAPHY CHEST WITH CONTRAST TECHNIQUE: Multidetector CT imaging of the chest was performed using the standard protocol during bolus administration of intravenous contrast. Multiplanar CT image reconstructions and MIPs were obtained to evaluate the vascular anatomy. CONTRAST:  100mL ISOVUE-370 IOPAMIDOL (ISOVUE-370) INJECTION 76%  COMPARISON:  Chest radiograph 06/28/2017 FINDINGS: Cardiovascular: Satisfactory opacification of the pulmonary arteries to segmental level. There is a central pulmonary embolus in the posterior aspect of bilateral main pulmonary arteries. The thrombus extends into all lobar branches, and is nearly occlusive in the left lower and right lower lobar arterial branches. Extension of thrombus is seen within segmental branches in bilateral lower lobes, lingula, right middle lobe. Evidence of right heart strain with RV/LV ratio of 1.2. Mediastinum/Nodes: No enlarged mediastinal, hilar, or axillary lymph nodes. Thyroid gland, and trachea demonstrate no significant findings. Small hiatal hernia. Lungs/Pleura: Small left pleural effusion. Atelectatic changes in the left lower lobe and lingula. Mild mosaic attenuation of the lung parenchyma, likely due to perfusion abnormalities. Upper Abdomen: No acute abnormality. Musculoskeletal: No chest wall abnormality. No acute or  significant osseous findings. Review of the MIP images confirms the above findings. IMPRESSION: Bilateral central pulmonary embolus extending to all lobar and most of segmental branches of bilateral lungs. Near occlusive in bilateral lower lobe pulmonary arterial branches. Evidence of right heart strain with RV/LV ratio equals 1.2 Small left pleural effusion. Mild mosaic attenuation of the lung parenchyma, likely due to perfusion abnormalities. Critical Value/emergent results were called by telephone at the time of interpretation on 08/01/2017 at 5:16 pm to Dr. Roger Shelter , who verbally acknowledged these results. Small hiatal hernia. Electronically Signed   By: Ted Mcalpine M.D.   On: 08/01/2017 17:20    Lab Data:  CBC: Recent Labs  Lab 08/01/17 1455 08/02/17 0918  WBC 13.2* 10.7*  HGB 12.5 11.2*  HCT 38.7 34.8*  MCV 89.8 90.2  PLT 208 177   Basic Metabolic Panel: Recent Labs  Lab 08/01/17 1455 08/02/17 0918  NA 137 137  K 3.8 3.7  CL 107  110  CO2 20* 20*  GLUCOSE 109* 99  BUN 11 9  CREATININE 1.00 0.64  CALCIUM 8.9 8.3*   GFR: Estimated Creatinine Clearance: 107.6 mL/min (by C-G formula based on SCr of 0.64 mg/dL). Liver Function Tests: No results for input(s): AST, ALT, ALKPHOS, BILITOT, PROT, ALBUMIN in the last 168 hours. No results for input(s): LIPASE, AMYLASE in the last 168 hours. No results for input(s): AMMONIA in the last 168 hours. Coagulation Profile: No results for input(s): INR, PROTIME in the last 168 hours. Cardiac Enzymes: No results for input(s): CKTOTAL, CKMB, CKMBINDEX, TROPONINI in the last 168 hours. BNP (last 3 results) No results for input(s): PROBNP in the last 8760 hours. HbA1C: No results for input(s): HGBA1C in the last 72 hours. CBG: No results for input(s): GLUCAP in the last 168 hours. Lipid Profile: No results for input(s): CHOL, HDL, LDLCALC, TRIG, CHOLHDL, LDLDIRECT in the last 72 hours. Thyroid Function Tests: No results for input(s): TSH, T4TOTAL, FREET4, T3FREE, THYROIDAB in the last 72 hours. Anemia Panel: No results for input(s): VITAMINB12, FOLATE, FERRITIN, TIBC, IRON, RETICCTPCT in the last 72 hours. Urine analysis: No results found for: COLORURINE, APPEARANCEUR, LABSPEC, PHURINE, GLUCOSEU, HGBUR, BILIRUBINUR, KETONESUR, PROTEINUR, UROBILINOGEN, NITRITE, LEUKOCYTESUR   Ashlan Dignan M.D. [facility] 08/02/2017, 12:48 PM  Pager: [phone] Between 7am to 7pm - call Pager - [phone]  After 7pm go to [email or web address] - password [email or web address]  Call night coverage person covering after 7pm

## 2017-08-02 NOTE — ED Notes (Signed)
Heart Healthy Diet was ordered for Lunch. 

## 2017-08-02 NOTE — ED Triage Notes (Signed)
DR Rai notified of US of LE.

## 2017-08-02 NOTE — ED Notes (Signed)
Attempted report 

## 2017-08-03 ENCOUNTER — Inpatient Hospital Stay (HOSPITAL_COMMUNITY): Payer: BLUE CROSS/BLUE SHIELD

## 2017-08-03 DIAGNOSIS — D649 Anemia, unspecified: Secondary | ICD-10-CM

## 2017-08-03 DIAGNOSIS — F419 Anxiety disorder, unspecified: Secondary | ICD-10-CM

## 2017-08-03 DIAGNOSIS — I2602 Saddle embolus of pulmonary artery with acute cor pulmonale: Secondary | ICD-10-CM

## 2017-08-03 DIAGNOSIS — S82892D Other fracture of left lower leg, subsequent encounter for closed fracture with routine healing: Secondary | ICD-10-CM

## 2017-08-03 DIAGNOSIS — I2692 Saddle embolus of pulmonary artery without acute cor pulmonale: Secondary | ICD-10-CM

## 2017-08-03 LAB — CBC
HEMATOCRIT: 33.8 % — AB (ref 36.0–46.0)
Hemoglobin: 10.8 g/dL — ABNORMAL LOW (ref 12.0–15.0)
MCH: 28.9 pg (ref 26.0–34.0)
MCHC: 32 g/dL (ref 30.0–36.0)
MCV: 90.4 fL (ref 78.0–100.0)
Platelets: 211 10*3/uL (ref 150–400)
RBC: 3.74 MIL/uL — ABNORMAL LOW (ref 3.87–5.11)
RDW: 14.1 % (ref 11.5–15.5)
WBC: 10.2 10*3/uL (ref 4.0–10.5)

## 2017-08-03 LAB — ECHOCARDIOGRAM COMPLETE
HEIGHTINCHES: 68 in
Weight: 3689.62 oz

## 2017-08-03 LAB — HEPARIN LEVEL (UNFRACTIONATED): HEPARIN UNFRACTIONATED: 0.55 [IU]/mL (ref 0.30–0.70)

## 2017-08-03 MED ORDER — RIVAROXABAN 15 MG PO TABS
15.0000 mg | ORAL_TABLET | Freq: Two times a day (BID) | ORAL | Status: DC
  Administered 2017-08-03 – 2017-08-04 (×3): 15 mg via ORAL
  Filled 2017-08-03 (×3): qty 1

## 2017-08-03 MED ORDER — RIVAROXABAN 20 MG PO TABS: 20.0000 mg | ORAL_TABLET | Freq: Every day | ORAL | Status: DC

## 2017-08-03 NOTE — Progress Notes (Signed)
PROGRESS NOTE    Diane Shaffer  ZOX:[id number] DOB: 03/30/1971 DOA: 08/01/2017 PCP: Patient, No Pcp Per   Brief Narrative:  HPI on 08/01/2017 by Dr. Thad Rangeripudeep Rai Patient is a 47 year old female with history of prior pulmonary embolism 10 years ago due to birth control pills, anxiety presented to ED with left-sided chest pain.  Patient reported that she had a left ankle fracture due to tripping in mud and had ORIF surgery on July 09, 2017 at [place].  She was placed on Lovenox for DVT prophylaxis however she quit after 5 days.  She was then placed on aspirin 325 mg twice a day by her orthopedics.  Patient reported that on Friday 3 days ago she worked with physical therapy and felt pain in her left leg.  Per patient, she noticed left-sided chest pain yesterday, initially felt like a cramp.  However today she was driving from work when she felt acute chest pain, radiating to her neck, 10 out of 10 in intensity, sharp, worse with breathing.  Interim history   Admitted for saddle PE and LLE DVT, and placed on IV heparin. Will transition to Xarelto today.  Assessment & Plan   Saddle PE/Acute LLE DVT -provoked secondary to left ankle fracture and ORIF -prior episode of blood clot 10 years ago -CTA chest showed B/L central emboli extending to all lobar and segmental branches of B/L lungs with right heart strain -Currently on heparin drip -Lower extremity doppler: Acute DVT Common femoral, femoral, popliteal and calf veins -Echocardiogram -Discussed oral anticoagulants with patient, she was on coumadin 10 years ago and prefers newer agent  -will transition patient to Xarelto -Case management consulted   Closed left ankle fracture -S/p ORIF on 07/19/17 -PT consulted  Anxiety -stable  Normocytic Anemia -possibly due to recent surgery vs anticoagulation -continue to monitor CBC  DVT Prophylaxis  Heparin -->Xarelto  Code Status: Full  Family Communication: None at bedside  Disposition  Plan: Admitted. Likely discharge to home in 24 hours. Pending start of Xarelto and PT.  Consultants None  Procedures  LE doppler Echocardiogram  Antibiotics   Anti-infectives (From admission, onward)   None      Subjective:   Diane GlowChristina Steller seen and examined today.  Patient feels breathing has mildly improved, however, has not been out of bed yet. Denies current chest pain, abdominal pain, nausea, vomiting, diarrhea, constipation, dizziness, headache.  Objective:   Vitals:   08/03/17 0017 08/03/17 0457 08/03/17 0732 08/03/17 1121  BP: 119/81 (!) 143/75 127/82 126/68  Pulse: 73 72 68 69  Resp: 14 (!) 21 17 19   Temp: 98.2 F (36.8 C) 98.3 F (36.8 C) 98.4 F (36.9 C) 98.7 F (37.1 C)  TempSrc: Oral Oral Oral Oral  SpO2: 98% 96% 98% 97%  Weight:  104.6 kg (230 lb 9.6 oz)    Height:        Intake/Output Summary (Last 24 hours) at 08/03/2017 1231 Last data filed at 08/03/2017 0941 Gross per 24 hour  Intake 1010 ml  Output 3501 ml  Net -2491 ml   Filed Weights   08/01/17 1715 08/03/17 0457  Weight: 104.3 kg (230 lb) 104.6 kg (230 lb 9.6 oz)    Exam  General: Well developed, well nourished, NAD, appears stated age  HEENT: NCAT, mucous membranes moist.   Neck: Supple  Cardiovascular: S1 S2 auscultated, no rubs, murmurs or gallops. Regular rate and rhythm.  Respiratory: Clear to auscultation bilaterally with equal chest rise  Abdomen: Soft, nontender, nondistended, +  bowel sounds  Extremities: warm dry without cyanosis clubbing or edema  Neuro: AAOx3, nonfocal  Psych: Normal affect and demeanor with intact judgement and insight   Data Reviewed: I have personally reviewed following labs and imaging studies  CBC: Recent Labs  Lab 08/01/17 1455 08/02/17 0918 08/03/17 0211  WBC 13.2* 10.7* 10.2  HGB 12.5 11.2* 10.8*  HCT 38.7 34.8* 33.8*  MCV 89.8 90.2 90.4  PLT 208 177 211   Basic Metabolic Panel: Recent Labs  Lab 08/01/17 1455 08/02/17 0918    NA 137 137  K 3.8 3.7  CL 107 110  CO2 20* 20*  GLUCOSE 109* 99  BUN 11 9  CREATININE 1.00 0.64  CALCIUM 8.9 8.3*   GFR: Estimated Creatinine Clearance: 107.7 mL/min (by C-G formula based on SCr of 0.64 mg/dL). Liver Function Tests: No results for input(s): AST, ALT, ALKPHOS, BILITOT, PROT, ALBUMIN in the last 168 hours. No results for input(s): LIPASE, AMYLASE in the last 168 hours. No results for input(s): AMMONIA in the last 168 hours. Coagulation Profile: No results for input(s): INR, PROTIME in the last 168 hours. Cardiac Enzymes: No results for input(s): CKTOTAL, CKMB, CKMBINDEX, TROPONINI in the last 168 hours. BNP (last 3 results) No results for input(s): PROBNP in the last 8760 hours. HbA1C: No results for input(s): HGBA1C in the last 72 hours. CBG: No results for input(s): GLUCAP in the last 168 hours. Lipid Profile: No results for input(s): CHOL, HDL, LDLCALC, TRIG, CHOLHDL, LDLDIRECT in the last 72 hours. Thyroid Function Tests: No results for input(s): TSH, T4TOTAL, FREET4, T3FREE, THYROIDAB in the last 72 hours. Anemia Panel: No results for input(s): VITAMINB12, FOLATE, FERRITIN, TIBC, IRON, RETICCTPCT in the last 72 hours. Urine analysis: No results found for: COLORURINE, APPEARANCEUR, LABSPEC, PHURINE, GLUCOSEU, HGBUR, BILIRUBINUR, KETONESUR, PROTEINUR, UROBILINOGEN, NITRITE, LEUKOCYTESUR Sepsis Labs: @LABRCNTIP (procalcitonin:4,lacticidven:4)  )No results found for this or any previous visit (from the past 240 hour(s)).    Radiology Studies: Ct Angio Chest Pe W/cm &/or Wo Cm  Result Date: 08/01/2017 CLINICAL DATA:  Chest pain radiating to the left breast. History of PE. EXAM: CT ANGIOGRAPHY CHEST WITH CONTRAST TECHNIQUE: Multidetector CT imaging of the chest was performed using the standard protocol during bolus administration of intravenous contrast. Multiplanar CT image reconstructions and MIPs were obtained to evaluate the vascular anatomy. CONTRAST:   ISOVUE-370 IOPAMIDOL (ISOVUE-370) INJECTION 76% COMPARISON:  Chest radiograph 06/28/2017 FINDINGS: Cardiovascular: Satisfactory opacification of the pulmonary arteries to segmental level. There is a central pulmonary embolus in the posterior aspect of bilateral main pulmonary arteries. The thrombus extends into all lobar branches, and is nearly occlusive in the left lower and right lower lobar arterial branches. Extension of thrombus is seen within segmental branches in bilateral lower lobes, lingula, right middle lobe. Evidence of right heart strain with RV/LV ratio of 1.2. Mediastinum/Nodes: No enlarged mediastinal, hilar, or axillary lymph nodes. Thyroid gland, and trachea demonstrate no significant findings. Small hiatal hernia. Lungs/Pleura: Small left pleural effusion. Atelectatic changes in the left lower lobe and lingula. Mild mosaic attenuation of the lung parenchyma, likely due to perfusion abnormalities. Upper Abdomen: No acute abnormality. Musculoskeletal: No chest wall abnormality. No acute or significant osseous findings. Review of the MIP images confirms the above findings. IMPRESSION: Bilateral central pulmonary embolus extending to all lobar and most of segmental branches of bilateral lungs. Near occlusive in bilateral lower lobe pulmonary arterial branches. Evidence of right heart strain with RV/LV ratio equals 1.2 Small left pleural effusion. Mild mosaic attenuation of  the lung parenchyma, likely due to perfusion abnormalities. Critical Value/emergent results were called by telephone at the time of interpretation on 08/01/2017 at 5:16 pm to Dr. Roger Shelter , who verbally acknowledged these results. Small hiatal hernia. Electronically Signed   By: Ted Mcalpine M.D.   On: 08/01/2017 17:20     Scheduled Meds: . rivaroxaban  15 mg Oral BID WC   Followed by  . [START ON 08/24/2017] rivaroxaban  20 mg Oral Q supper   Continuous Infusions: . sodium chloride Stopped (08/03/17 1022)      LOS: 2 days   Time Spent in minutes   30 minutes  Graydon Fofana D.O. on 08/03/2017 at 12:31 PM  Between 7am to 7pm - Pager - [phone]  After 7pm go to [email or web address] - password [email or web address]  And look for the night coverage person covering for me after hours  [facility] Office  [phone]

## 2017-08-03 NOTE — Progress Notes (Signed)
Per insurance check on Xarelto vs Eliquis  # 14.  S/W JEAN @ [facility] # [phone]    1. XARELTO 20 MG DAILY  COVER- YES  CO-PAY- ZERO DOLLARS  PRIOR APPROVAL- NO   2. XARELTO 15 MG BID  COVER- YES  CO-PAY- ZERO DOLLARS  PRIOR APPROVAL- NO   3. ELIQUIS 2.5 MG BID  COVER- YES  CO-PAY- ZERO DOLLARS  PRIOR APPROVAL- NO   4. ELIQUIS 5 MG BID  COVER- YES  CO-PAY-ZERO DOLLARS  PRIOR APPROVAL- NO   PREFERRED PHARMACY: YES - [facility] , [facility] AND [facility]

## 2017-08-03 NOTE — Progress Notes (Signed)
ANTICOAGULATION CONSULT NOTE  Pharmacy Consult for heparin>> xarelto Indication: pulmonary embolus  Allergies  Allergen Reactions  . Morphine     Headache.   Patient Measurements: Height: 5\' 8"  (172.7 cm) Weight: 230 lb 9.6 oz (104.6 kg) IBW/kg (Calculated) : 63.9 Heparin Dosing Weight: 87 Kg  Vital Signs: Temp: 98.4 F (36.9 C) (04/03 0732) Temp Source: Oral (04/03 0732) BP: 127/82 (04/03 0732) Pulse Rate: 68 (04/03 0732)  Labs: Recent Labs    08/01/17 1455  08/02/17 0918 08/02/17 1203 08/02/17 2003 08/03/17 0211  HGB 12.5  --  11.2*  --   --  10.8*  HCT 38.7  --  34.8*  --   --  33.8*  PLT 208  --  177  --   --  211  HEPARINUNFRC  --    < >  --  0.55 0.55 0.55  CREATININE 1.00  --  0.64  --   --   --    < > = values in this interval not displayed.   Estimated Creatinine Clearance: 107.7 mL/min (by C-G formula based on SCr of 0.64 mg/dL).   Assessment:  Diane Shaffer presenting with chest pain found to have bilateral PE with right heart strain. No anticoagulation PTA. Transitioning to xarelto today -Heparin level remains therapeutic at 0.55 on 1800 units/hr.  -hg= 10.8 (down trend), plt= 211 -CrCl > 100  Goal of Therapy:  Heparin level 0.3-0.7 units/ml Monitor platelets by anticoagulation protocol: Yes   Plan:  -discontinue heparin  -Xarelto 15mg  po bid for 21 days then 20mg  po daily -Will provide patient education  Harland GermanAndrew Cevin Rubinstein, PharmD Clinical Pharmacist Clinical phone from 8:30-4:00 is [phone] After 4pm, please call Main Rx [phone]) for assistance. 08/03/2017 8:51 AM

## 2017-08-03 NOTE — Progress Notes (Signed)
  Echocardiogram 2D Echocardiogram has been performed.  Diane Shaffer, Diane Shaffer 08/03/2017, 9:18 AM

## 2017-08-03 NOTE — Care Management Note (Signed)
Case Management Note Donn PieriniKristi Cantrell Martus RN, BSN Unit 4E-Case Manager [phone]  Patient Details  Name: Nino GlowChristina Duce MRN: [id number] Date of Birth: 09/13/1970  Subjective/Objective:  Pt admitted with PE/DVT                  Action/Plan: PTA pt lived at home, independent- consult received for Noacs- per insurance check pt has Zero dollar copay for either Xarelto or Eliquis- per MD plan is to use Xarelto- spoke with pt at bedside- since her copay is zero dollar she deferred needing any of the Xarelto cards- per pt she uses her pharmacy at work - but since she will not be at work this week- will plan to use [facility] in [place] to fill her first 30 days- then use her work pharmacy starting next month- will need 2 paper scripts for discharge- one to take to [facility] first 30 days/no refills- then one to take to her work pharmacy with refills-  No other CM needs noted for transition home.   Expected Discharge Date:    08/04/17              Expected Discharge Plan:  Home/Self Care  In-House Referral:  NA  Discharge planning Services  CM Consult, Medication Assistance  Post Acute Care Choice:  NA Choice offered to:  NA  DME Arranged:    DME Agency:     HH Arranged:    HH Agency:     Status of Service:  Completed, signed off  If discussed at Long Length of Stay Meetings, dates discussed:    Discharge Disposition: home/self care   Additional Comments:  Darrold SpanWebster, Alicha Raspberry Hall, RN 08/03/2017, 2:48 PM

## 2017-08-03 NOTE — Discharge Instructions (Signed)
Information on my medicine - XARELTO (rivaroxaban)  This medication education was reviewed with me or my healthcare representative as part of my discharge preparation.   WHY WAS XARELTO PRESCRIBED FOR YOU? Xarelto was prescribed to treat blood clots that may have been found in the veins of your legs (deep vein thrombosis) or in your lungs (pulmonary embolism) and to reduce the risk of them occurring again.  What do you need to know about Xarelto? The starting dose is one 15 mg tablet taken TWICE daily with food for the FIRST 21 DAYS then on 08/24/17  the dose is changed to one 20 mg tablet taken ONCE A DAY with your evening meal.  DO NOT stop taking Xarelto without talking to the health care provider who prescribed the medication.  Refill your prescription for 20 mg tablets before you run out.  After discharge, you should have regular check-up appointments with your healthcare provider that is prescribing your Xarelto.  In the future your dose may need to be changed if your kidney function changes by a significant amount.  What do you do if you miss a dose? If you are taking Xarelto TWICE DAILY and you miss a dose, take it as soon as you remember. You may take two 15 mg tablets (total 30 mg) at the same time then resume your regularly scheduled 15 mg twice daily the next day.  If you are taking Xarelto ONCE DAILY and you miss a dose, take it as soon as you remember on the same day then continue your regularly scheduled once daily regimen the next day. Do not take two doses of Xarelto at the same time.   Important Safety Information Xarelto is a blood thinner medicine that can cause bleeding. You should call your healthcare provider right away if you experience any of the following: ? Bleeding from an injury or your nose that does not stop. ? Unusual colored urine (red or dark brown) or unusual colored stools (red or black). ? Unusual bruising for unknown reasons. ? A serious fall or  if you hit your head (even if there is no bleeding).  Some medicines may interact with Xarelto and might increase your risk of bleeding while on Xarelto. To help avoid this, consult your healthcare provider or pharmacist prior to using any new prescription or non-prescription medications, including herbals, vitamins, non-steroidal anti-inflammatory drugs (NSAIDs) and supplements.  This website has more information on Xarelto: VisitDestination.com.br.

## 2017-08-03 NOTE — Evaluation (Signed)
Physical Therapy Evaluation Patient Details Name: Diane Shaffer MRN: [id number] DOB: Oct 26, 1970 Today's Date: 08/03/2017   History of Present Illness  47yo female presenting to the ED with L sided chest pain 10/10 in intensity and shortness of breath, also wtih recent history of ankle fracture with surgery for ORIF on 07/09/17. Imaging shows massive PE, note heparin levels therapeutic at time of PT eval. PMH anxiety, hx of PE in 2008, surgery for ankle ORIF 07/09/17  Clinical Impression   Patient received in bed, pleasant and willing to participate with PT today. She reports at baseline since her surgery on 07/09/17, she has been NWB on her L LE and has been able to get around her home and back to work with crutches and knee scooter without difficulty. She is able to complete bed mobility on a Mod(I) basis, as well as functional transfers and gait with S for safety and limited by fatigue today. Able to perform swing to gait pattern and maintain NWB status L LE without difficulty today. She was left up in the chair with all needs met this afternoon, nursing staff aware of mobility status and precautions. She will continue to benefit from skilled PT services in the acute setting; recommend return to care at established OP PT clinic moving forward.    Follow Up Recommendations Outpatient PT(recommend she continue with established OP PT clinic )    Equipment Recommendations  None recommended by PT(has all necessary DME )    Recommendations for Other Services       Precautions / Restrictions Precautions Precautions: None Restrictions Weight Bearing Restrictions: Yes LLE Weight Bearing: Non weight bearing Other Position/Activity Restrictions: has surgical boot for L foot, patient states her orthopedic told her she only needs to wear it for comfort/PRN       Mobility  Bed Mobility Overal bed mobility: Modified Independent                Transfers Overall transfer level: Needs  assistance Equipment used: Rolling walker (2 wheeled) Transfers: Sit to/from Stand Sit to Stand: Supervision         General transfer comment: S for safety, able to independently maintain NWB L LE   Ambulation/Gait Ambulation/Gait assistance: Supervision Ambulation Distance (Feet): 150 Feet Assistive device: Rolling walker (2 wheeled) Gait Pattern/deviations: Step-to pattern     General Gait Details: swing to pattern with rolling walker, fatigued at end of gait distance   Stairs            Wheelchair Mobility    Modified Rankin (Stroke Patients Only)       Balance Overall balance assessment: No apparent balance deficits (not formally assessed)                                           Pertinent Vitals/Pain Pain Assessment: 0-10 Pain Score: 2  Pain Location: L ankle  Pain Descriptors / Indicators: Aching;Sore Pain Intervention(s): Limited activity within patient's tolerance;Monitored during session;Repositioned    Home Living Family/patient expects to be discharged to:: Private residence Living Arrangements: Spouse/significant other Available Help at Discharge: Family Type of Home: House Home Access: Stairs to enter Entrance Stairs-Rails: Can reach both   Home Layout: Two level Home Equipment: Crutches;Other (comment)(knee scooter ) Additional Comments: has been doing well on crutches and with scooter, has returned to work     Prior Function Level of Independence: Independent with  assistive device(s)         Comments: has been doing well with crutches, reports no functional difficulties at home, getting outpatient PT for her ankle      Hand Dominance        Extremity/Trunk Assessment        Lower Extremity Assessment Lower Extremity Assessment: Overall WFL for tasks assessed    Cervical / Trunk Assessment Cervical / Trunk Assessment: Normal  Communication   Communication: No difficulties  Cognition Arousal/Alertness:  Awake/alert Behavior During Therapy: WFL for tasks assessed/performed Overall Cognitive Status: Within Functional Limits for tasks assessed                                        General Comments      Exercises     Assessment/Plan    PT Assessment Patient needs continued PT services  PT Problem List Decreased strength;Decreased mobility       PT Treatment Interventions DME instruction;Therapeutic activities;Gait training;Therapeutic exercise;Patient/family education;Stair training;Balance training;Functional mobility training;Neuromuscular re-education    PT Goals (Current goals can be found in the Care Plan section)  Acute Rehab PT Goals Patient Stated Goal: to go home  PT Goal Formulation: With patient Time For Goal Achievement: 08/17/17 Potential to Achieve Goals: Good    Frequency Min 3X/week   Barriers to discharge        Co-evaluation               AM-PAC PT "6 Clicks" Daily Activity  Outcome Measure Difficulty turning over in bed (including adjusting bedclothes, sheets and blankets)?: None Difficulty moving from lying on back to sitting on the side of the bed? : None Difficulty sitting down on and standing up from a chair with arms (e.g., wheelchair, bedside commode, etc,.)?: None Help needed moving to and from a bed to chair (including a wheelchair)?: None Help needed walking in hospital room?: None Help needed climbing 3-5 steps with a railing? : A Little 6 Click Score: 23    End of Session   Activity Tolerance: Patient tolerated treatment well Patient left: in chair;with call bell/phone within reach Nurse Communication: Mobility status PT Visit Diagnosis: Muscle weakness (generalized) (M62.81);Difficulty in walking, not elsewhere classified (R26.2);History of falling (Z91.81)    Time: 1450-1505 PT Time Calculation (min) (ACUTE ONLY): 15 min   Charges:   PT Evaluation $PT Eval Moderate Complexity: 1 Mod     PT G Codes:         Deniece Ree PT, DPT, CBIS  Supplemental Physical Therapist [place]   Pager [phone]

## 2017-08-04 LAB — CBC
HCT: 33.3 % — ABNORMAL LOW (ref 36.0–46.0)
HEMOGLOBIN: 11.2 g/dL — AB (ref 12.0–15.0)
MCH: 30.1 pg (ref 26.0–34.0)
MCHC: 33.6 g/dL (ref 30.0–36.0)
MCV: 89.5 fL (ref 78.0–100.0)
PLATELETS: 257 10*3/uL (ref 150–400)
RBC: 3.72 MIL/uL — AB (ref 3.87–5.11)
RDW: 14.2 % (ref 11.5–15.5)
WBC: 11.3 10*3/uL — AB (ref 4.0–10.5)

## 2017-08-04 MED ORDER — RIVAROXABAN (XARELTO) VTE STARTER PACK (15 & 20 MG): ORAL_TABLET | ORAL | 0 refills | Status: DC

## 2017-08-04 MED ORDER — UNABLE TO FIND: 0 refills | Status: DC

## 2017-08-04 NOTE — Progress Notes (Addendum)
Physical Therapy Treatment Patient Details Name: Diane Shaffer MRN: [id number] DOB: 01/28/1971 Today's Date: 08/04/2017    History of Present Illness 47yo female presenting to the ED with L sided chest pain 10/10 in intensity and shortness of breath, also wtih recent history of ankle fracture with surgery for ORIF on 07/09/17. Imaging shows massive PE, note heparin levels therapeutic at time of PT eval. PMH anxiety, hx of PE in 2008, surgery for ankle ORIF 07/09/17    PT Comments    Pt was received in bed and ready to participate in PT.  She remains to be NWB on L foot but feels confident to ambulate without her boot.  She completed bed mobility Mod I and sit to stand with supervision using crutches.  Cues were given for hand/crutch placement during standing.  Crutches were adjusted to fit the pt and pt mentioned she has her own crutches at home.  She was able to maintain NWB status and swing to gait using crutches for ambulation and for stairs.  She was left in her chair with feet up and her phone and call button.  Pt was able to complete ankle pumps, circles, and alphabet tracing without any cues.  Min education on technique was needed to get her to perform the rest of therapeutic exercise.  Emphasized education/demonstration to teach pt. to stretch her PF of her L ankle with gait belt/towel.  She will continue to benefit from skilled PT services to help her ankle heal properly and have full return of ROM.  Remains a good candidate for Outpatient PT.    Follow Up Recommendations  Outpatient PT     Equipment Recommendations  None recommended by PT    Recommendations for Other Services       Precautions / Restrictions Precautions Precautions: None Restrictions Weight Bearing Restrictions: Yes LLE Weight Bearing: Non weight bearing Other Position/Activity Restrictions: has surgical boot for L foot, patient states her orthopedic told her she only needs to wear it for comfort/PRN. Can also be  worn during ambulation for protection.      Mobility  Bed Mobility Overal bed mobility: Modified Independent                Transfers Overall transfer level: (Supervision with cues given on where to place crutches when standing) Equipment used: Crutches Transfers: Sit to/from Stand Sit to Stand: Supervision         General transfer comment: S to make sure she completed with proper technique.  Ambulation/Gait Ambulation/Gait assistance: Modified independent (Device/Increase time)   Assistive device: Crutches Gait Pattern/deviations: Step-to pattern     General Gait Details: swing to pattern with crutches, not  fatigued at end of gait distance    Stairs Stairs: Yes   Stair Management: No rails Number of Stairs: 11 General stair comments: Patient completed easily with minimal cueing and needed Supervision with crutches.  Cueing was given for crutch placement and where to hold her L leg.   Wheelchair Mobility    Modified Rankin (Stroke Patients Only)       Balance Overall balance assessment: No apparent balance deficits (not formally assessed)                                          Cognition Arousal/Alertness: Awake/alert Behavior During Therapy: WFL for tasks assessed/performed Overall Cognitive Status: Within Functional Limits for tasks assessed  Exercises General Exercises - Lower Extremity Ankle Circles/Pumps: AROM;10 reps;Seated;Both(Pt did circles(L only) and pumps(B)) Long Arc Quad: AROM;10 reps;Seated;Both Hip Flexion/Marching: AROM;Both;10 reps;Seated Other Exercises Other Exercises: Alphabet trace/AROM/L foot/seated with feet up Other Exercises: Strecthing with gait belt/Plantar flexors/10s/L ankle/seated with feet up    General Comments        Pertinent Vitals/Pain Pain Assessment: Faces Faces Pain Scale: Hurts a little bit Pain Location: L ankle  Pain  Descriptors / Indicators: Sore Pain Intervention(s): Repositioned    Home Living                      Prior Function            PT Goals (current goals can now be found in the care plan section) Acute Rehab PT Goals Patient Stated Goal: to go home  PT Goal Formulation: With patient Time For Goal Achievement: 08/17/17 Potential to Achieve Goals: Good Progress towards PT goals: Goals met and updated - see care plan(Goals met but not updated.)    Frequency    Min 3X/week      PT Plan      Co-evaluation              AM-PAC PT "6 Clicks" Daily Activity  Outcome Measure  Difficulty turning over in bed (including adjusting bedclothes, sheets and blankets)?: None Difficulty moving from lying on back to sitting on the side of the bed? : None Difficulty sitting down on and standing up from a chair with arms (e.g., wheelchair, bedside commode, etc,.)?: None Help needed moving to and from a bed to chair (including a wheelchair)?: None Help needed walking in hospital room?: None Help needed climbing 3-5 steps with a railing? : None 6 Click Score: 24    End of Session Equipment Utilized During Treatment: Gait belt Activity Tolerance: Patient tolerated treatment well Patient left: in chair;with call bell/phone within reach Nurse Communication: Mobility status PT Visit Diagnosis: Muscle weakness (generalized) (M62.81);Difficulty in walking, not elsewhere classified (R26.2);History of falling (Z91.81)     Time: 4469-5072 PT Time Calculation (min) (ACUTE ONLY): 17 min  Charges:  $Gait Training: 8-22 mins                    G Codes:       Terri Skains, SPTA [phone]   Terri Skains 08/04/2017, 4:08 PM

## 2017-08-04 NOTE — Progress Notes (Signed)
08/04/2017 1520 Discharge AVS meds taken today and those due this evening reviewed.  Follow-up appointments and when to call md reviewed.  D/C IV and TELE.  Questions and concerns addressed.   D/C home per orders. Kathryne HitchAllen, Juanita Streight C

## 2017-08-04 NOTE — Discharge Summary (Signed)
Physician Discharge Summary  Diane Shaffer GLO:[id number] DOB: April 13, 1971 DOA: 08/01/2017  PCP: Patient, No Pcp Per  Admit date: 08/01/2017 Discharge date: 08/04/2017  Time spent: 45 minutes  Recommendations for Outpatient Follow-up:  Patient will be discharged to home with outpatient physical therapy.  Patient will need to follow up with primary care provider within one week of discharge.  Follow up with your orthopedic surgeon as scheduled. Patient should continue medications as prescribed.  Patient should follow a regular diet.   Discharge Diagnoses:  Saddle PE/Acute LLE DVT Closed left ankle fracture Anxiety Normocytic Anemia  Discharge Condition: Stable  Diet recommendation: regular  Filed Weights   08/01/17 1715 08/03/17 0457 08/04/17 0405  Weight: 104.3 kg (230 lb) 104.6 kg (230 lb 9.6 oz) 101.9 kg (224 lb 11.2 oz)    History of present illness:  on 08/01/2017 by Dr. Thad Ranger Patient is a 47 year old female with history of prior pulmonary embolism 10 years ago due to birth control pills, anxiety presented to ED with left-sided chest pain. Patient reported that she had a left ankle fracture due [place] hadORIFsurgery on [facility], [facility]at [facility].She was placed on Lovenox for DVT prophylaxis however she quit after 5 days.She was then placed on aspirin 325 mg twice a day by her orthopedics.Patient reported that on Friday 3 days ago she worked with physical therapy and felt pain in her left leg.Per patient, she noticed left-sided chest pain yesterday, initially felt like a cramp.However today she was driving from work when she felt acute chest pain, radiating to her neck,10 out of 10 in intensity, sharp, worse with breathing.  Hospital Course:  Saddle PE/Acute LLE DVT -provoked secondary to left ankle fracture and ORIF -prior episode of blood clot 10 years ago -CTA chest showed B/L central emboli extending to all lobar and segmental branches of B/L  lungs with right heart strain -Was placed on heparin drip -Lower extremity doppler: Acute DVT Common femoral, femoral, popliteal and calf veins -Echocardiogram EF 55-60%, shin was normal, no regional wall motion abnormalities.  Left ventricular diastolic function parameters normal.  Mildly D shaped interventricular septum suggesting RV pressure/volume overload. -Discussed echocardiogram findings with cardiology with Dr. Shirlee Latch, who stated echocardiogram was mainly normal and this could be expected with pulmonary embolism. -Discussed oral anticoagulants with patient, she was on coumadin 10 years ago and prefers newer agent  -Transitioned patient to Xarelto -Case management consulted   Closed left ankle fracture -S/p ORIF on 07/19/17 -PT consulted and recommended outpatient physical therapy  Anxiety -stable  Normocytic Anemia -possibly due to recent surgery vs anticoagulation -hemoglobin stable, currently 11.2  Consultants None  Procedures  LE doppler Echocardiogram  Discharge Exam: Vitals:   08/04/17 0405 08/04/17 0800  BP: 117/76 124/79  Pulse: 68 65  Resp: 13   Temp: 97.7 F (36.5 C) 97.7 F (36.5 C)  SpO2: 96% 98%   Patient feels her breathing has improved and is no longer having chest pain with deep inhalation.  Denies current abdominal pain, nausea or vomiting, diarrhea or constipation, dizziness or headache.   General: Well developed, well nourished, NAD, appears stated age  HEENT: NCAT, mucous membranes moist.  Neck: Supple  Cardiovascular: S1 S2 auscultated, no rubs, murmurs or gallops. Regular rate and rhythm.  Respiratory: Clear to auscultation bilaterally with equal chest rise  Abdomen: Soft, nontender, nondistended, + bowel sounds  Extremities: warm dry without cyanosis clubbing or edema  Neuro: AAOx3, nonfocal  Psych: Normal affect and demeanor with intact judgement  and insight, pleasant  Discharge Instructions Discharge Instructions     Discharge instructions   Complete by:  As directed    Patient will be discharged to home with outpatient physical therapy.  Patient will need to follow up with primary care provider within one week of discharge.  Follow up with your orthopedic surgeon as scheduled. Patient should continue medications as prescribed.  Patient should follow a regular diet.     Allergies as of 08/04/2017      Reactions   Morphine    Headache.      Medication List    STOP taking these medications   lidocaine 5 % Commonly known as:  LIDODERM     TAKE these medications   acetaminophen 500 MG tablet Commonly known as:  TYLENOL Take 1,000 mg by mouth every 6 (six) hours as needed.   oxyCODONE-acetaminophen 5-325 MG tablet Commonly known as:  PERCOCET/ROXICET Take 1 tablet by mouth every 6 (six) hours as needed.   Rivaroxaban 15 & 20 MG Tbpk Take as directed on package: Start with one 15mg  tablet twice daily with food. On Day 22, switch to one 20mg  tablet daily with food.   UNABLE TO FIND Outpatient physical therapy      Allergies  Allergen Reactions  . Morphine     Headache.   Follow-up Information    Primary care physician. Schedule an appointment as soon as possible for a visit in 1 week(s).   Why:  Hospital follow up           The results of significant diagnostics from this hospitalization (including imaging, microbiology, ancillary and laboratory) are listed below for reference.    Significant Diagnostic Studies: Ct Angio Chest Pe W/cm &/or Wo Cm  Result Date: 08/01/2017 CLINICAL DATA:  Chest pain radiating to the left breast. History of PE. EXAM: CT ANGIOGRAPHY CHEST WITH CONTRAST TECHNIQUE: Multidetector CT imaging of the chest was performed using the standard protocol during bolus administration of intravenous contrast. Multiplanar CT image reconstructions and MIPs were obtained to evaluate the vascular anatomy. CONTRAST:  ISOVUE-370 IOPAMIDOL (ISOVUE-370) INJECTION 76%  COMPARISON:  Chest radiograph 06/28/2017 FINDINGS: Cardiovascular: Satisfactory opacification of the pulmonary arteries to segmental level. There is a central pulmonary embolus in the posterior aspect of bilateral main pulmonary arteries. The thrombus extends into all lobar branches, and is nearly occlusive in the left lower and right lower lobar arterial branches. Extension of thrombus is seen within segmental branches in bilateral lower lobes, lingula, right middle lobe. Evidence of right heart strain with RV/LV ratio of 1.2. Mediastinum/Nodes: No enlarged mediastinal, hilar, or axillary lymph nodes. Thyroid gland, and trachea demonstrate no significant findings. Small hiatal hernia. Lungs/Pleura: Small left pleural effusion. Atelectatic changes in the left lower lobe and lingula. Mild mosaic attenuation of the lung parenchyma, likely due to perfusion abnormalities. Upper Abdomen: No acute abnormality. Musculoskeletal: No chest wall abnormality. No acute or significant osseous findings. Review of the MIP images confirms the above findings. IMPRESSION: Bilateral central pulmonary embolus extending to all lobar and most of segmental branches of bilateral lungs. Near occlusive in bilateral lower lobe pulmonary arterial branches. Evidence of right heart strain with RV/LV ratio equals 1.2 Small left pleural effusion. Mild mosaic attenuation of the lung parenchyma, likely due to perfusion abnormalities. Critical Value/emergent results were called by telephone at the time of interpretation on 08/01/2017 at 5:16 pm to Dr. Roger Shelter , who verbally acknowledged these results. Small hiatal hernia. Electronically Signed   By: Sharlyne Pacas  Dimitrova M.D.   On: 08/01/2017 17:20    Microbiology: No results found for this or any previous visit (from the past 240 hour(s)).   Labs: Basic Metabolic Panel: Recent Labs  Lab 08/01/17 1455 08/02/17 0918  NA 137 137  K 3.8 3.7  CL 107 110  CO2 20* 20*  GLUCOSE 109* 99  BUN 11 9    CREATININE 1.00 0.64  CALCIUM 8.9 8.3*   Liver Function Tests: No results for input(s): AST, ALT, ALKPHOS, BILITOT, PROT, ALBUMIN in the last 168 hours. No results for input(s): LIPASE, AMYLASE in the last 168 hours. No results for input(s): AMMONIA in the last 168 hours. CBC: Recent Labs  Lab 08/01/17 1455 08/02/17 0918 08/03/17 0211 08/04/17 0311  WBC 13.2* 10.7* 10.2 11.3*  HGB 12.5 11.2* 10.8* 11.2*  HCT 38.7 34.8* 33.8* 33.3*  MCV 89.8 90.2 90.4 89.5  PLT 208 177 211 257   Cardiac Enzymes: No results for input(s): CKTOTAL, CKMB, CKMBINDEX, TROPONINI in the last 168 hours. BNP: BNP (last 3 results) Recent Labs    08/01/17 1455  BNP 37.8    ProBNP (last 3 results) No results for input(s): PROBNP in the last 8760 hours.  CBG: No results for input(s): GLUCAP in the last 168 hours.     Signed:  Edsel PetrinMaryann Annalysa Mohammad  [facility] 08/04/2017, 11:59 AM

## 2017-08-04 NOTE — Care Management Note (Addendum)
Case Management Note Donn PieriniKristi Meng Winterton RN, BSN Unit 4E-Case Manager [phone]  Patient Details  Name: Diane GlowChristina Shaffer MRN: [id number] Date of Birth: 01/31/1971  Subjective/Objective:  Pt admitted with PE/DVT                  Action/Plan: PTA pt lived at home, independent- consult received for Noacs- per insurance check pt has Zero dollar copay for either Xarelto or Eliquis- per MD plan is to use Xarelto- spoke with pt at bedside- since her copay is zero dollar she deferred needing any of the Xarelto cards- per pt she uses her pharmacy at work - but since she will not be at work this week- will plan [facility] [place] to [facility] her first 30 days- then use her work pharmacy starting next month- will need 2 paper scripts for discharge- one to [facility] first [facility]/no refills- then one to take to her work pharmacy with refills-  No other CM needs noted for transition home.   Expected Discharge Date:  08/04/17 08/04/17              Expected Discharge Plan:  Home/Self Care  In-House Referral:  NA  Discharge planning Services  CM Consult, Medication Assistance  Post Acute Care Choice:  NA Choice offered to:  NA  DME Arranged:    DME Agency:     HH Arranged:    HH Agency:     Status of Service:  Completed, signed off  If discussed at [employer] Length of Stay Meetings, dates discussed:    Discharge Disposition: home/self care   Additional Comments:  08/04/17- 1215- Donn PieriniKristi Maral Lampe RN, CM- pt for d/c home today- recommendation for outpt PT- MD to give written prescription- spoke with pt at bedside per pt she was already established in outpt PT for ankle fx- will continue with location she was at- and take new prescription- she states her PCP is through clinic at work- she will f/u there for Xarelto needs- explained to pt about starter pack - [facility] [place]- [facility] pack in stock- also gave [facility] [address] [facility] with paper script.    Darrold SpanWebster, [name], RN 08/04/2017, 12:18 PM

## 2017-12-01 HISTORY — PX: SHOULDER SURGERY: SHX246

## 2019-02-07 ENCOUNTER — Encounter: Payer: Self-pay | Admitting: *Deleted

## 2019-02-08 ENCOUNTER — Other Ambulatory Visit: Payer: Self-pay

## 2019-02-08 ENCOUNTER — Ambulatory Visit: Payer: BC Managed Care – PPO | Admitting: Neurology

## 2019-02-08 ENCOUNTER — Encounter: Payer: Self-pay | Admitting: Neurology

## 2019-02-08 VITALS — BP 136/77 | HR 75 | Temp 97.6°F | Ht 69.0 in | Wt 213.8 lb

## 2019-02-08 DIAGNOSIS — R252 Cramp and spasm: Secondary | ICD-10-CM | POA: Insufficient documentation

## 2019-02-08 MED ORDER — PREGABALIN 50 MG PO CAPS: 150.0000 mg | ORAL_CAPSULE | Freq: Every day | ORAL | 5 refills | Status: DC

## 2019-02-08 NOTE — Progress Notes (Signed)
PATIENT: Diane Shaffer DOB: [birth year]  Chief Complaint  Patient presents with  . Nocturnal leg cramps    New pt, seen 2016 "pain in legs/muscles, calf and feet at night or after long rest or randomly; severe tightening and pain w/movement"     HISTORICAL  Diane Shaffer is a 48 year old female, seen in request by her primary care doctor [place], Cristela Blue for evaluation of bilateral lower extremity muscle cramps, initial evaluation was on February 08, 2019.  I have reviewed and summarized the referring note from the referring physician.  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  Since 2010, she had episode of intermittent calf muscle tightness, often wake her up from sleep, she felt her calf muscle is so tired, has to get up from sleep stretch her leg, it can happen in left, or right leg, lasting for few minutes, running through hot water would help her symptoms, gradually getting worse, she is waking up every couple hours each night, has difficulty sleep, she is mild low back pain, denies shooting pain to lower extremity, denies bowel and bladder incontinence  Evaluations showed normal CBC, CMP, TSH was 0.05, CPK of 57, mildly elevated ESR 49  She has tried gabapentin in the past without helping her symptoms,  REVIEW OF SYSTEMS: Full 14 system review of systems performed and notable only for as above All other review of systems were negative.  ALLERGIES: Allergies  Allergen Reactions  . Lovenox [Enoxaparin Sodium] Nausea Only    headache  . Morphine Hives    Headache.    HOME MEDICATIONS: Current Outpatient Medications  Medication Sig Dispense Refill  . cyclobenzaprine (FLEXERIL) 10 MG tablet Take 10 mg by mouth 3 (three) times daily as needed for muscle spasms.    . rivaroxaban (XARELTO) 20 MG TABS tablet Take 20 mg by mouth daily  with supper.    Marland Kitchen acetaminophen (TYLENOL) 500 MG tablet Take 1,000 mg by mouth every 6 (six) hours as needed.    . pregabalin (LYRICA) 50 MG capsule Take 3 capsules (150 mg total) by mouth at bedtime. 90 capsule 5   No current facility-administered medications for this visit.     PAST MEDICAL HISTORY: Past Medical History:  Diagnosis Date  . Anxiety   . Asthma   . Depression   . DVT (deep vein thrombosis) in pregnancy 08/2017  . Nocturnal leg cramps   . Palpitations   . Pulmonary embolism ([place]) 2008   released by MD, result of birth control    PAST SURGICAL HISTORY: Past Surgical History:  Procedure Laterality Date  . ABDOMINAL HYSTERECTOMY  2005  . ANKLE SURGERY Left 06/2017   ORIF  . LAPAROSCOPIC ASSISTED VAGINAL HYSTERECTOMY  03/16/2012   Procedure: LAPAROSCOPIC ASSISTED VAGINAL HYSTERECTOMY;  Surgeon: Marylynn Pearson, MD;  Location: [facility];  Service: Gynecology;  Laterality: N/A;  . SHOULDER SURGERY Right 12/2017   rotator cuff  . TUBAL LIGATION      FAMILY HISTORY: Family History  Problem Relation Age of Onset  . COPD Mother   . Kidney cancer Father   . Diabetes Maternal Uncle   . Heart disease Maternal Grandfather   . Thyroid disease Sister     SOCIAL HISTORY: Social History   Socioeconomic History  . Marital status: Married    Spouse name: Not on file  . Number of children:  2  . Years of education: 34  . Highest education level: Not on file  Occupational History  . Not on file  Social Needs  . Financial resource strain: Not on file  . Food insecurity    Worry: Not on file    Inability: Not on file  . Transportation needs    Medical: Not on file    Non-medical: Not on file  Tobacco Use  . Smoking status: Never Smoker  . Smokeless tobacco: Never Used  Substance and Sexual Activity  . Alcohol use: Yes    Alcohol/week: 3.0 standard drinks    Types: 3 Glasses of wine per week    Comment: socially, 4-6/weekly  . Drug use: No  . Sexual activity:  Not on file  Lifestyle  . Physical activity    Days per week: Not on file    Minutes per session: Not on file  . Stress: Not on file  Relationships  . Social Herbalist on phone: Not on file    Gets together: Not on file    Attends religious service: Not on file    Active member of club or organization: Not on file    Attends meetings of clubs or organizations: Not on file    Relationship status: Not on file  . Intimate partner violence    Fear of current or ex partner: Not on file    Emotionally abused: Not on file    Physically abused: Not on file    Forced sexual activity: Not on file  Other Topics Concern  . Not on file  Social History Narrative   Lives with husband and 2 children in a one story home.     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     Education: high school.   Caffeine- 3, 8 oz weekly     PHYSICAL EXAM   Vitals:   02/08/19 0747  BP: 136/77  Pulse: 75  Temp: 97.6 F (36.4 C)  Weight: 213 lb 12.8 oz (97 kg)  Height: '5\' 9"'  (1.753 m)    Not recorded      Body mass index is 31.57 kg/m.  PHYSICAL EXAMNIATION:  Gen: NAD, conversant, well nourised, well groomed                     Cardiovascular: Regular rate rhythm, no peripheral edema, warm, nontender. Eyes: Conjunctivae clear without exudates or hemorrhage Neck: Supple, no carotid bruits. Pulmonary: Clear to auscultation bilaterally   NEUROLOGICAL EXAM:  MENTAL STATUS: Speech:    Speech is normal; fluent and spontaneous with normal comprehension.  Cognition:     Orientation to time, place and person     Normal recent and remote memory     Normal Attention span and concentration     Normal Language, naming, repeating,spontaneous speech     Fund of knowledge   CRANIAL NERVES: CN II: Visual fields are full to confrontation.  pupils are round equal and briskly reactive to light. CN III, IV, VI: extraocular movement are normal. No ptosis. CN V: Facial  sensation is intact to pinprick in all 3 divisions bilaterally. Corneal responses are intact.  CN VII: Face is symmetric with normal eye closure and smile. CN VIII: Hearing is normal to causal conversation. CN IX, X: Palate elevates symmetrically. Phonation is normal. CN XI: Head turning and shoulder shrug are intact CN XII: Tongue is midline with normal movements and no atrophy.  MOTOR:  There is no pronator drift of out-stretched arms. Muscle bulk and tone are normal. Muscle strength is normal.  REFLEXES: Reflexes are 2+ and symmetric at the biceps, triceps, knees, and ankles. Plantar responses are flexor.  SENSORY: Intact to light touch, pinprick, positional sensation and vibratory sensation are intact in fingers and toes.  COORDINATION: Rapid alternating movements and fine finger movements are intact. There is no dysmetria on finger-to-nose and heel-knee-shin.    GAIT/STANCE: Posture is normal. Gait is steady with normal steps, base, arm swing, and turning. Heel and toe walking are normal. Tandem gait is normal.  Romberg is absent.   DIAGNOSTIC DATA (LABS, IMAGING, TESTING) - I reviewed patient records, labs, notes, testing and imaging myself where available.   ASSESSMENT AND PLAN  Jadwiga Faidley is a 48 y.o. female   Lower extremity muscle cramp  Laboratory evaluations for potential etiology  Lyrica 50 mg may take up to 3 tablets every night   Marcial Pacas, M.D. Ph.D.  [facility] [address] [place], [place] [address] Ph: [phone] Fax: [phone]  CC: Referring Provider

## 2019-02-09 LAB — TSH: TSH: 1.29 u[IU]/mL (ref 0.450–4.500)

## 2019-02-09 LAB — FERRITIN: Ferritin: 120 ng/mL (ref 15–150)

## 2019-02-09 LAB — CK: Total CK: 61 U/L (ref 32–182)

## 2019-02-09 LAB — COPPER, SERUM: Copper: 127 ug/dL (ref 72–166)

## 2019-02-09 LAB — VITAMIN B12: Vitamin B-12: 652 pg/mL (ref 232–1245)

## 2019-02-09 LAB — VITAMIN D 25 HYDROXY (VIT D DEFICIENCY, FRACTURES): Vit D, 25-Hydroxy: 21.4 ng/mL — ABNORMAL LOW (ref 30.0–100.0)

## 2019-04-09 ENCOUNTER — Ambulatory Visit: Payer: Self-pay | Admitting: Neurology

## 2019-05-10 ENCOUNTER — Ambulatory Visit: Payer: BC Managed Care – PPO | Admitting: Neurology

## 2019-05-10 ENCOUNTER — Encounter: Payer: Self-pay | Admitting: Neurology

## 2019-05-10 ENCOUNTER — Other Ambulatory Visit: Payer: Self-pay

## 2019-05-10 VITALS — BP 118/68 | HR 87 | Temp 97.9°F | Ht 69.0 in | Wt 222.0 lb

## 2019-05-10 DIAGNOSIS — R252 Cramp and spasm: Secondary | ICD-10-CM

## 2019-05-10 MED ORDER — PREGABALIN 50 MG PO CAPS: 150.0000 mg | ORAL_CAPSULE | Freq: Every day | ORAL | 5 refills | Status: DC

## 2019-05-10 NOTE — Progress Notes (Signed)
PATIENT: Diane Shaffer DOB: 29-Dec-1970  Chief Complaint  Patient presents with  . Leg Cramps    Reports her cramps have greatly improved since being on Lyrica 36m, three capsules at bedtime.  She has been able to sleep through the night.      HISTORICAL  CMarjorie Depreyis a 49year old female, seen in request by her primary care doctor Hamrick, MCristela Bluefor evaluation of bilateral lower extremity muscle cramps, initial evaluation was on February 08, 2019.  I have reviewed and summarized the referring note from the referring physician.  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  Since 2010, she had episode of intermittent calf muscle tightness, often wake her up from sleep, she felt her calf muscle is so tired, has to get up from sleep stretch her leg, it can happen in left, or right leg, lasting for few minutes, running through hot water would help her symptoms, gradually getting worse, she is waking up every couple hours each night, has difficulty sleep, she is mild low back pain, denies shooting pain to lower extremity, denies bowel and bladder incontinence  Evaluations showed normal CBC, CMP, TSH was 0.05, CPK of 57, mildly elevated ESR 49  She has tried gabapentin in the past without helping her symptoms,  UPDATE Jan 7th 2021:  Since Lyrica 50 mg 3 tablets every night, she had only rare recurrent of muscle cramps, sleep much better  REVIEW OF SYSTEMS: Full 14 system review of systems performed and notable only for as above All other review of systems were negative.  ALLERGIES: Allergies  Allergen Reactions  . Lovenox [Enoxaparin Sodium] Nausea Only    headache  . Morphine Hives    Headache.    HOME MEDICATIONS: Current Outpatient Medications  Medication Sig Dispense Refill  . acetaminophen (TYLENOL) 500 MG tablet Take 1,000 mg  by mouth every 6 (six) hours as needed.    . cyclobenzaprine (FLEXERIL) 10 MG tablet Take 10 mg by mouth 3 (three) times daily as needed for muscle spasms.    . pregabalin (LYRICA) 50 MG capsule Take 3 capsules (150 mg total) by mouth at bedtime. 90 capsule 5  . rivaroxaban (XARELTO) 20 MG TABS tablet Take 20 mg by mouth daily with supper.     No current facility-administered medications for this visit.    PAST MEDICAL HISTORY: Past Medical History:  Diagnosis Date  . Anxiety   . Asthma   . Depression   . DVT (deep vein thrombosis) in pregnancy 08/2017  . Nocturnal leg cramps   . Palpitations   [place]   released by MD, result of birth control    PAST SURGICAL HISTORY: Past Surgical History:  Procedure Laterality Date  . ABDOMINAL HYSTERECTOMY  2005  . ANKLE SURGERY Left 06/2017   ORIF  . LAPAROSCOPIC ASSISTED VAGINAL HYSTERECTOMY  03/16/2012   Procedure: LAPAROSCOPIC ASSISTED VAGINAL HYSTERECTOMY;  Surgeon: GMarylynn Pearson MD;  [facility];  Service: Gynecology;  Laterality: N/A;  . SHOULDER SURGERY Right 12/2017   rotator cuff  . TUBAL LIGATION      FAMILY HISTORY: Family History  Problem Relation Age of Onset  . COPD Mother   . Kidney cancer Father   . Diabetes Maternal Uncle   . Heart disease Maternal Grandfather   . Thyroid disease Sister  SOCIAL HISTORY: Social History   Socioeconomic History  . Marital status: Married    Spouse name: Not on file  . Number of children: 2  . Years of education: 25  . Highest education level: Not on file  Occupational History  . Not on file  Tobacco Use  . Smoking status: Never Smoker  . Smokeless tobacco: Never Used  Substance and Sexual Activity  . Alcohol use: Yes    Alcohol/week: 3.0 standard drinks    Types: 3 Glasses of wine per week    Comment: socially, 4-6/weekly  . Drug use: No  . Sexual activity: Not on file  Other Topics Concern  . Not on file  Social History Narrative    Lives with husband and 2 children in a one story home.     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     Education: high school.   Caffeine- 3, 8 oz weekly   Social Determinants of Health   Financial Resource Strain:   . Difficulty of Paying Living Expenses: Not on file  Food Insecurity:   . Worried About Charity fundraiser in the Last Year: Not on file  . Ran Out of Food in the Last Year: Not on file  Transportation Needs:   . Lack of Transportation (Medical): Not on file  . Lack of Transportation (Non-Medical): Not on file  Physical Activity:   . Days of Exercise per Week: Not on file  . Minutes of Exercise per Session: Not on file  Stress:   . Feeling of Stress : Not on file  Social Connections:   . Frequency of Communication with Friends and Family: Not on file  . Frequency of Social Gatherings with Friends and Family: Not on file  . Attends Religious Services: Not on file  . Active Member of Clubs or Organizations: Not on file  . Attends Archivist Meetings: Not on file  . Marital Status: Not on file  Intimate Partner Violence:   . Fear of Current or Ex-Partner: Not on file  . Emotionally Abused: Not on file  . Physically Abused: Not on file  . Sexually Abused: Not on file     PHYSICAL EXAM   Vitals:   05/10/19 1350  BP: 118/68  Pulse: 87  Temp: 97.9 F (36.6 C)  Weight: 222 lb (100.7 kg)  Height: '5\' 9"'  (1.753 m)    Not recorded      Body mass index is 32.78 kg/m.  PHYSICAL EXAMNIATION:  Gen: NAD, conversant, well nourised, well groomed                     Cardiovascular: Regular rate rhythm, no peripheral edema, warm, nontender. Eyes: Conjunctivae clear without exudates or hemorrhage Neck: Supple, no carotid bruits. Pulmonary: Clear to auscultation bilaterally   NEUROLOGICAL EXAM:  MENTAL STATUS: Speech:    Speech is normal; fluent and spontaneous with normal comprehension.  Cognition:     Orientation to time,  place and person     Normal recent and remote memory     Normal Attention span and concentration     Normal Language, naming, repeating,spontaneous speech     Fund of knowledge   CRANIAL NERVES: CN II: Visual fields are full to confrontation.  pupils are round equal and briskly reactive to light. CN III, IV, VI: extraocular movement are normal. No ptosis. CN V: Facial sensation is intact to pinprick in all 3 divisions bilaterally. Corneal  responses are intact.  CN VII: Face is symmetric with normal eye closure and smile. CN VIII: Hearing is normal to causal conversation. CN IX, X: Palate elevates symmetrically. Phonation is normal. CN XI: Head turning and shoulder shrug are intact CN XII: Tongue is midline with normal movements and no atrophy.  MOTOR: There is no pronator drift of out-stretched arms. Muscle bulk and tone are normal. Muscle strength is normal.  REFLEXES: Reflexes are 2+ and symmetric at the biceps, triceps, knees, and ankles. Plantar responses are flexor.  SENSORY: Intact to light touch, pinprick, positional sensation and vibratory sensation are intact in fingers and toes.  COORDINATION: Rapid alternating movements and fine finger movements are intact. There is no dysmetria on finger-to-nose and heel-knee-shin.    GAIT/STANCE: Posture is normal. Gait is steady with normal steps, base, arm swing, and turning. Heel and toe walking are normal. Tandem gait is normal.  Romberg is absent.   DIAGNOSTIC DATA (LABS, IMAGING, TESTING) - I reviewed patient records, labs, notes, testing and imaging myself where available.   ASSESSMENT AND PLAN  Marsi Turvey is a 49 y.o. female   Lower extremity muscle cramp Reviewed leaving will [facility] Pontes keep on sending the labs  Lyrica 50 mg may take up to 3 tablets every night   Marcial Pacas, M.D. Ph.D.  [facility] [address] [place], [place] [address] Ph: [phone] Fax: [phone]   CC: Referring Provider

## 2019-09-21 IMAGING — US ULTRASOUND LEFT BREAST LIMITED
1 series · 6 of 6 positions shown · non-contrast
Comparison: Previous exam(s).

CLINICAL DATA: Patient was called back from screening mammogram for
a left breast mass.

EXAM:
ULTRASOUND OF THE LEFT BREAST

[Series 1: ultrasound left breast limited · 0.06mm/px · 6 of 6 slices shown]
[im 1/6]
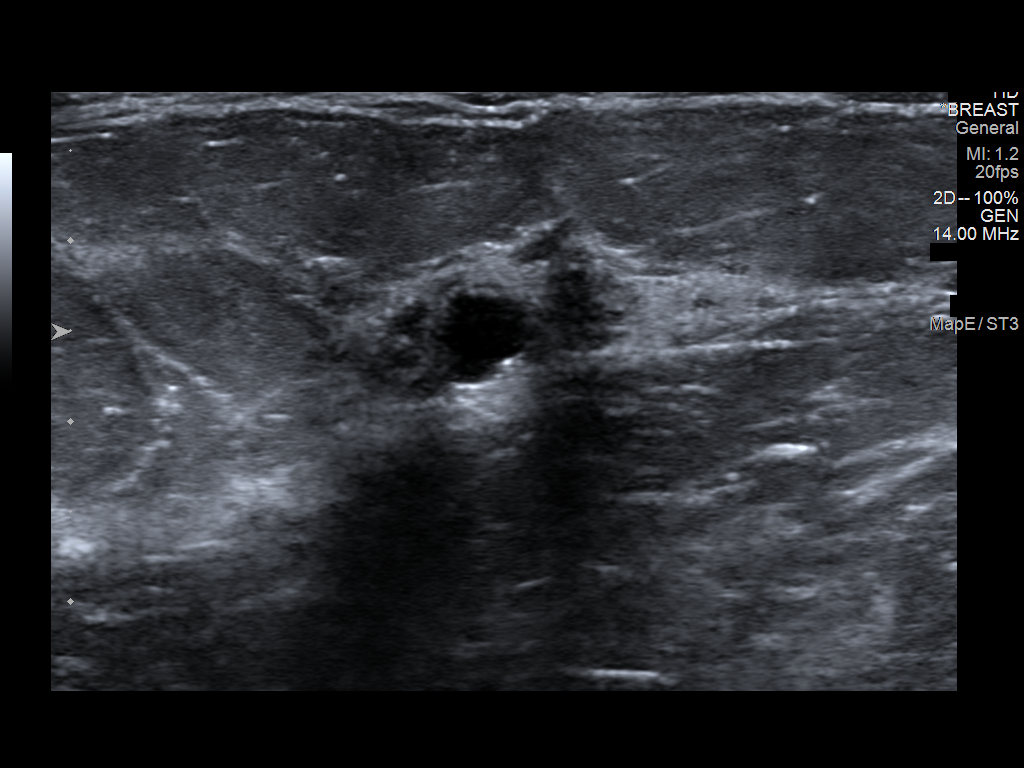
[im 2/6]
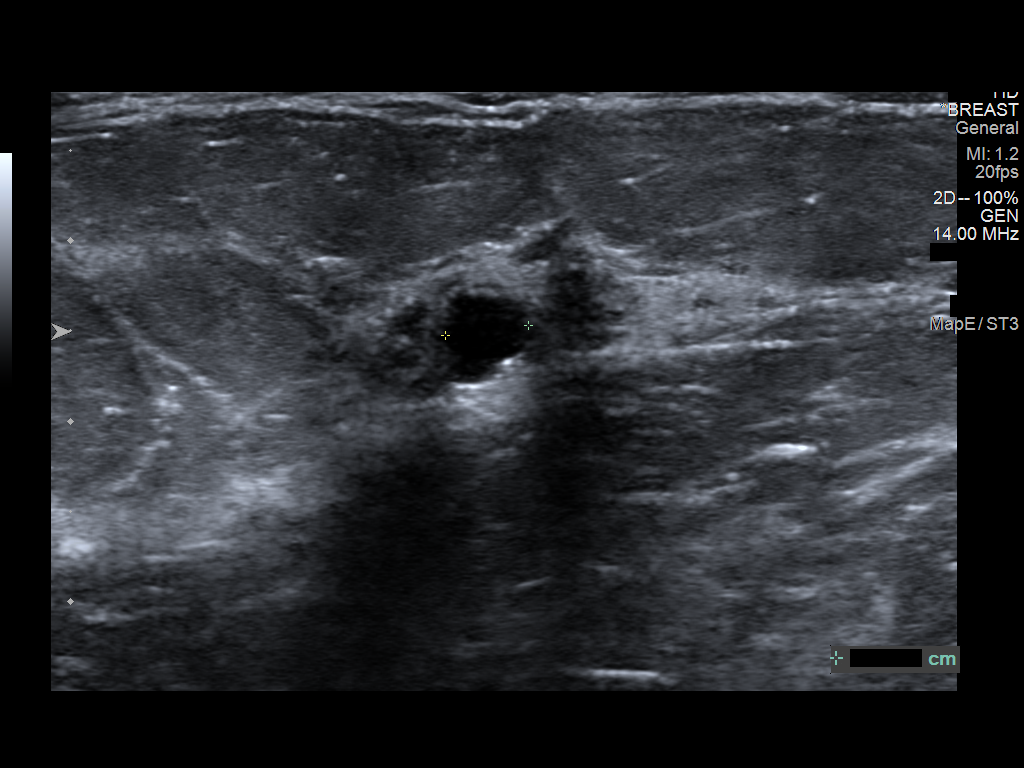
[im 3/6]
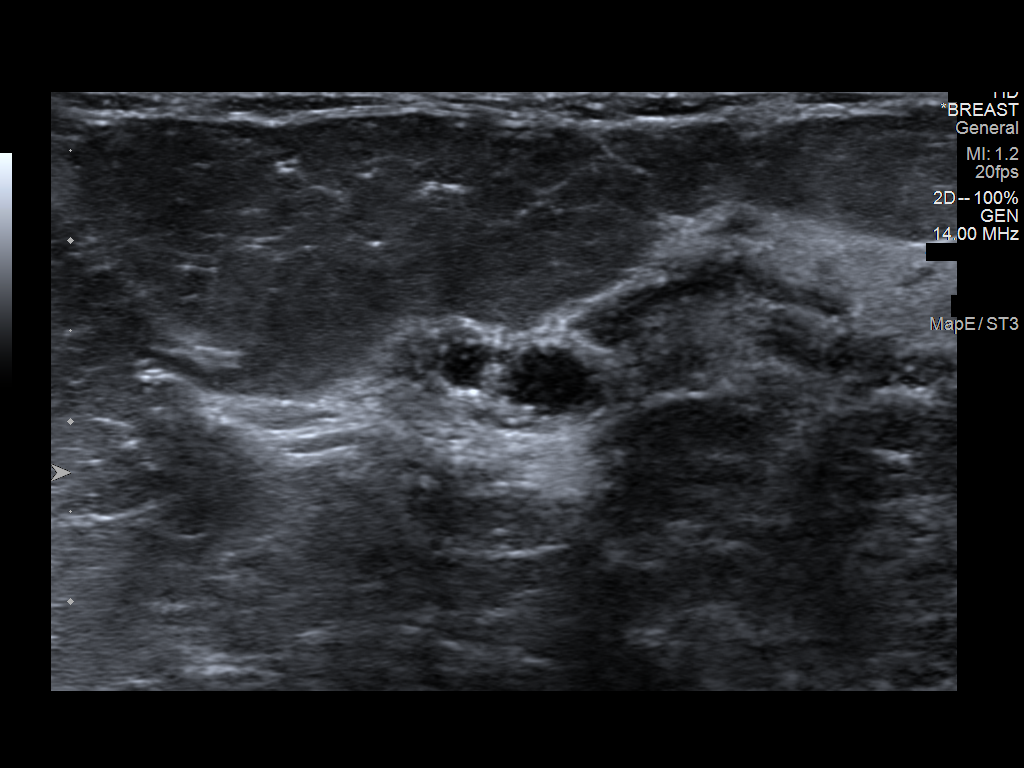
[im 4/6]
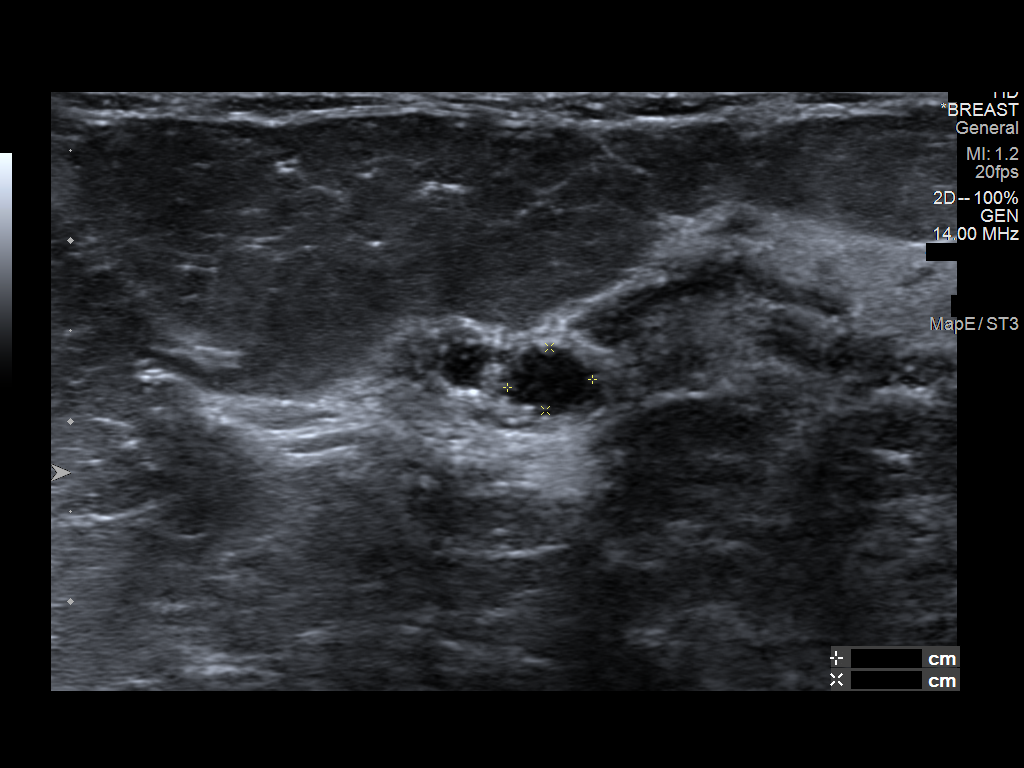
[im 5/6]
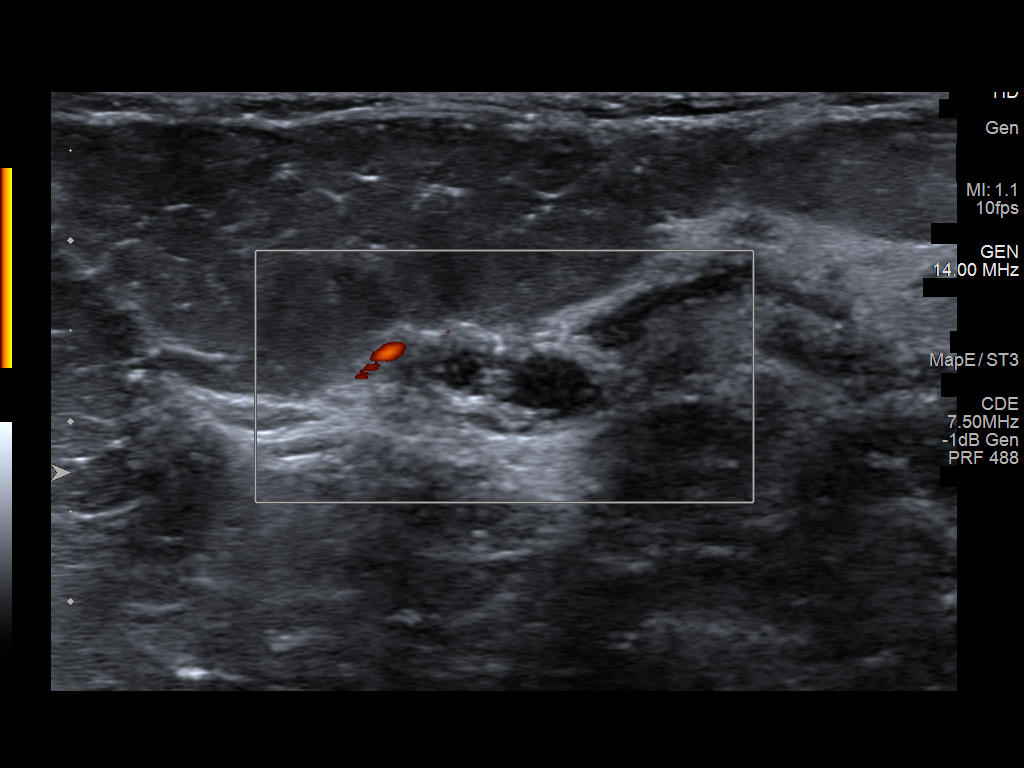
[im 6/6]
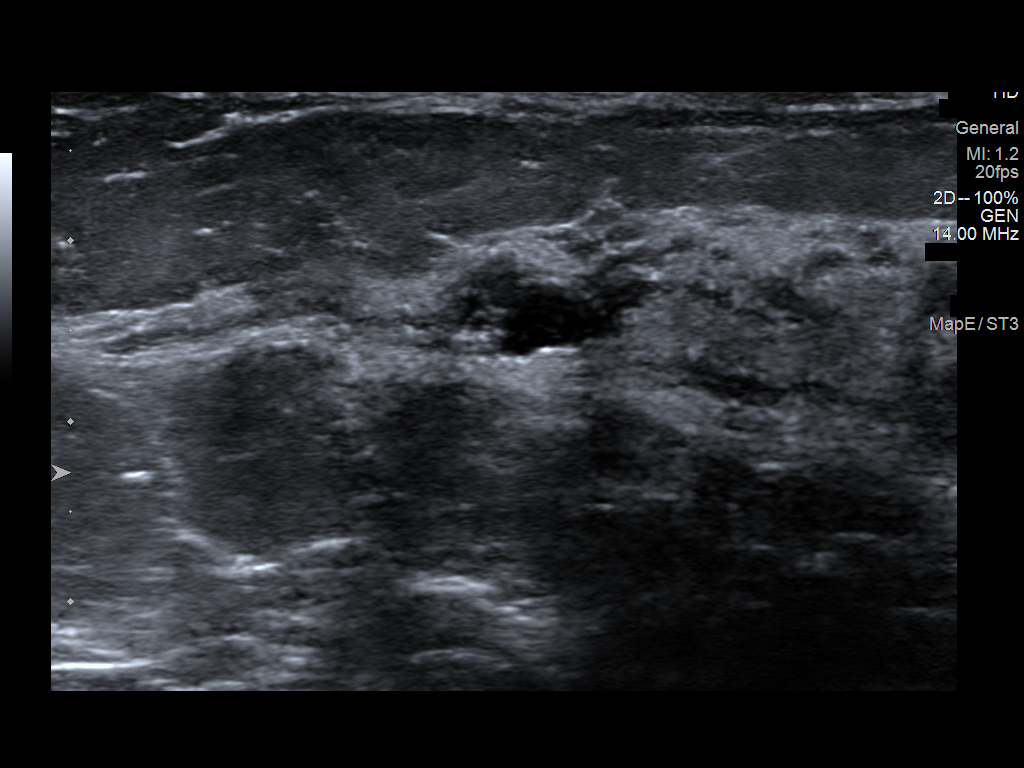

[6 of 6 positions shown; findings below may reference images not displayed]

FINDINGS: On physical exam, no mass is palpated in the lower-inner quadrant of
the left breast.

Targeted ultrasound is performed, showing dense fibroglandular
tissue in the 9 o'clock region of the left breast 4 cm from the
nipple with 2 adjacent simple cysts. The larger cyst measures 5 x 5
x 4 mm. There is a smaller adjacent cyst. No solid mass or abnormal
shadowing detected.
IMPRESSION: Left breast cyst.  No evidence of malignancy.

RECOMMENDATION:
Bilateral screening mammogram in Monday April, 2018 is recommended.

I have discussed the findings and recommendations with the patient.
Results were also provided in writing at the conclusion of the
visit. If applicable, a reminder letter will be sent to the patient
regarding the next appointment.

BI-RADS CATEGORY  2: Benign.

## 2019-11-01 ENCOUNTER — Other Ambulatory Visit: Payer: Self-pay | Admitting: Obstetrics and Gynecology

## 2019-11-01 DIAGNOSIS — R319 Hematuria, unspecified: Secondary | ICD-10-CM

## 2019-11-01 DIAGNOSIS — R110 Nausea: Secondary | ICD-10-CM

## 2019-11-01 DIAGNOSIS — R1011 Right upper quadrant pain: Secondary | ICD-10-CM

## 2019-11-12 ENCOUNTER — Other Ambulatory Visit: Payer: Self-pay | Admitting: Neurology

## 2019-11-13 ENCOUNTER — Telehealth: Payer: Self-pay | Admitting: *Deleted

## 2019-11-13 NOTE — Telephone Encounter (Signed)
Faxed printed/signed rx pregabalin to pharmacy at [phone]. Received fax confirmation.

## 2019-11-15 ENCOUNTER — Other Ambulatory Visit: Payer: BLUE CROSS/BLUE SHIELD

## 2019-12-14 IMAGING — CT CT ANGIO CHEST
2 of 6 series · 18 of 36 positions shown · IV contrast (Omni 300)
Comparison: Chest radiograph 06/28/2017

CLINICAL DATA: Chest pain radiating to the left breast. History of
PE.

EXAM:
CT ANGIOGRAPHY CHEST WITH CONTRAST
TECHNIQUE: Multidetector CT imaging of the chest was performed using the
standard protocol during bolus administration of intravenous
contrast. Multiplanar CT image reconstructions and MIPs were
obtained to evaluate the vascular anatomy.
CONTRAST:  100mL 23JOHH-6EA IOPAMIDOL (23JOHH-6EA) INJECTION 76%

[Series 7: pe thins · axial · 0.96mm/px · z∈[+1242,+1498]mm · 17 of 288 slices shown]
[im 16/288  lung]
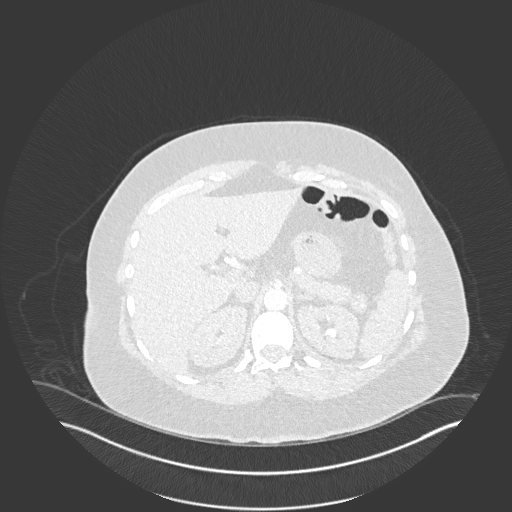
[im 32/288  mediastinal]
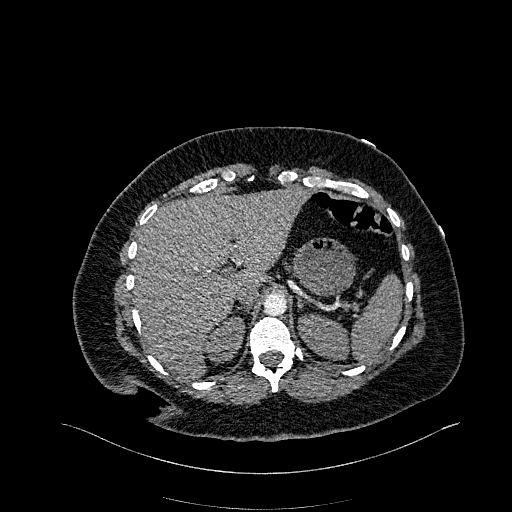
[im 48/288  lung]
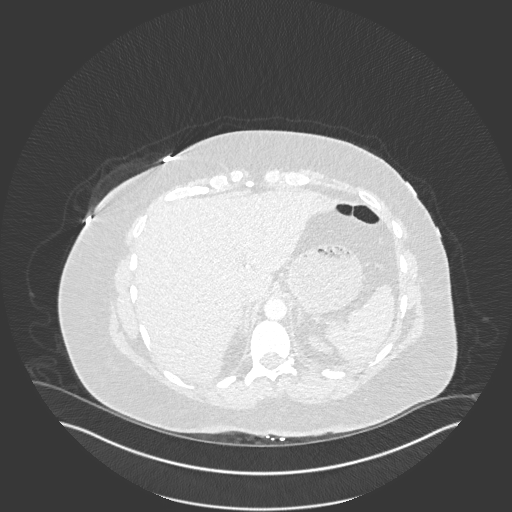
[im 64/288  mediastinal]
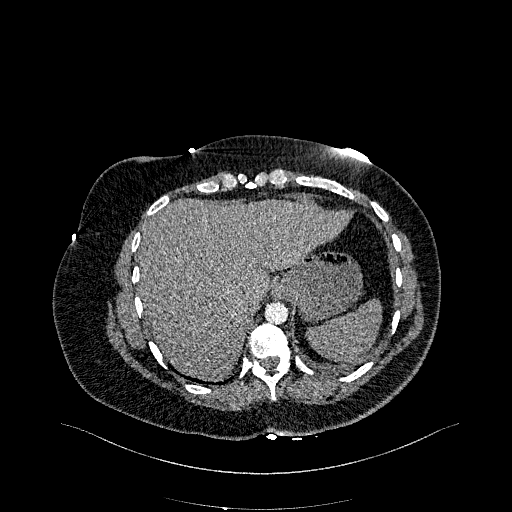
[im 80/288  lung]
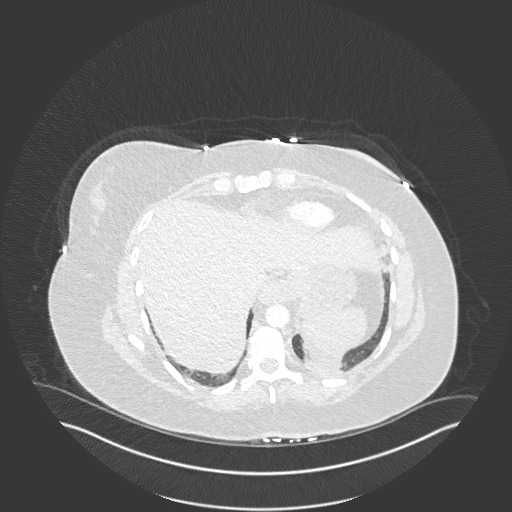
[im 96/288  mediastinal]
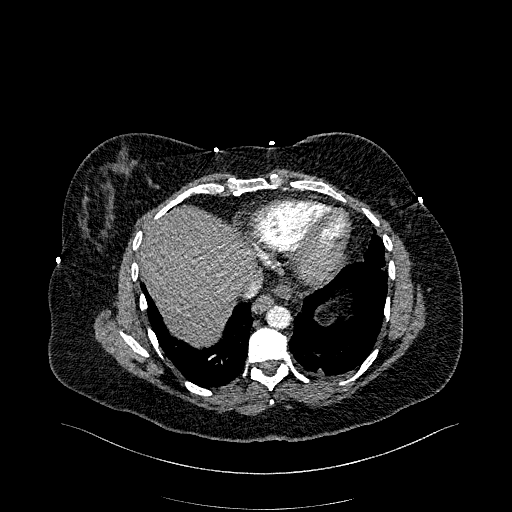
[im 112/288  lung]
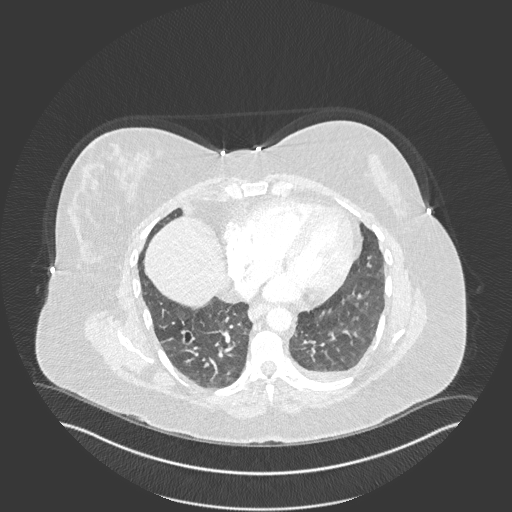
[im 128/288  mediastinal]
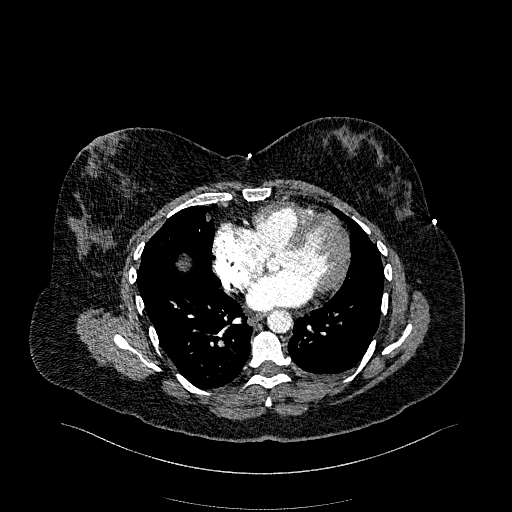
[im 144/288  lung]
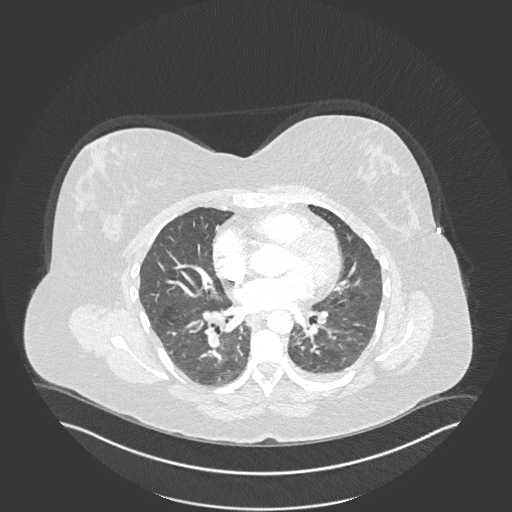
[im 160/288  mediastinal]
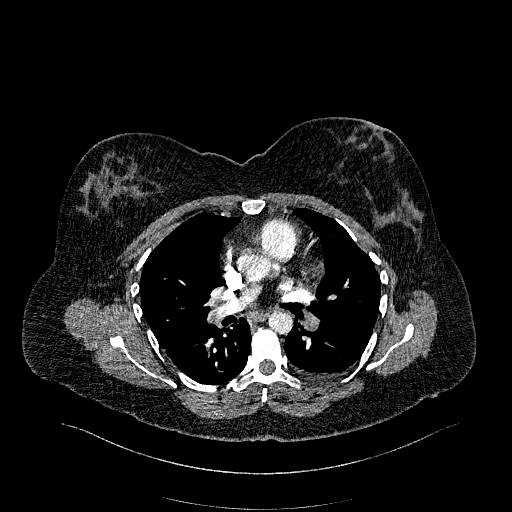
[im 176/288  lung]
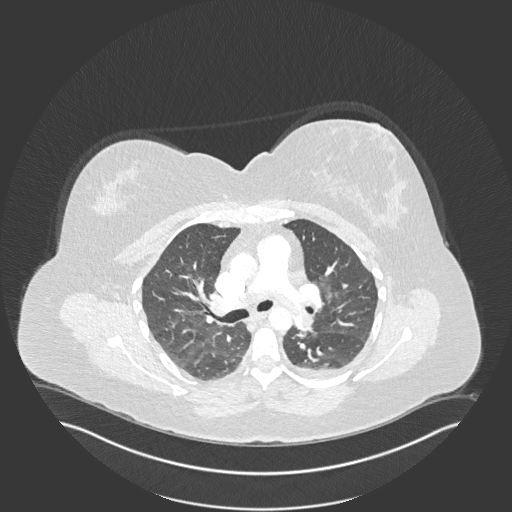
[im 192/288  mediastinal]
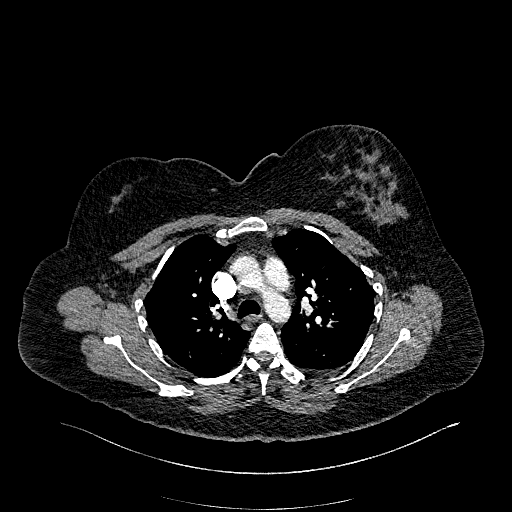
[im 208/288  lung]
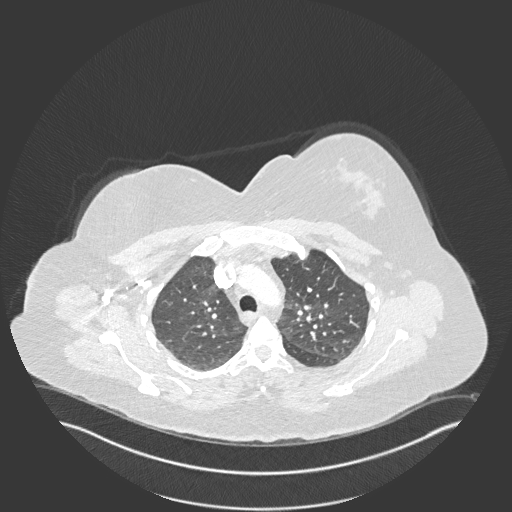
[im 224/288  mediastinal]
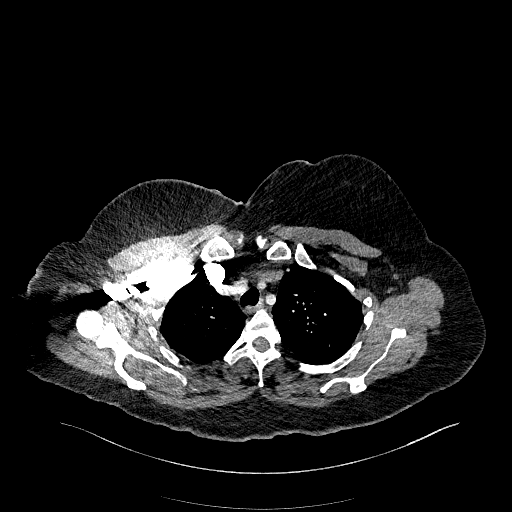
[im 240/288  lung]
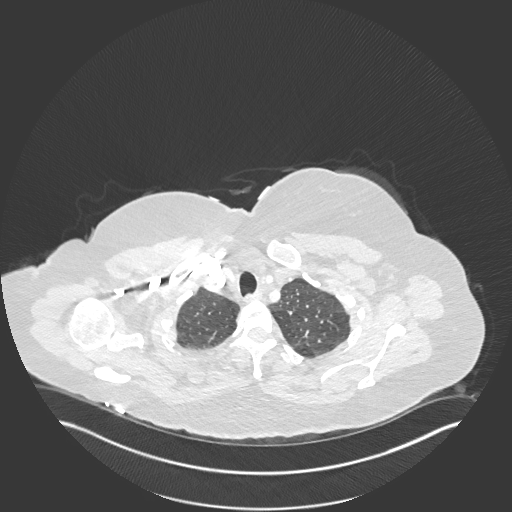
[im 256/288  mediastinal]
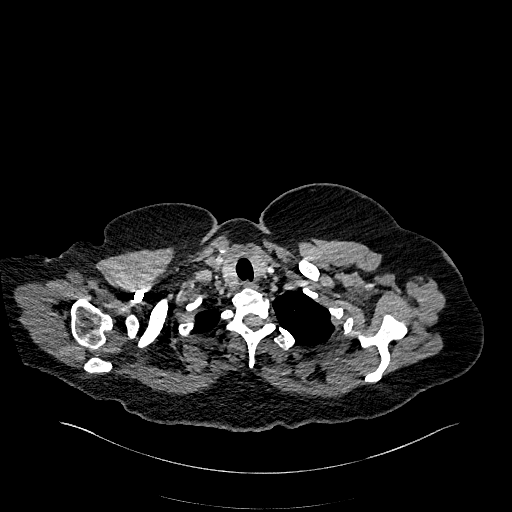
[im 272/288  lung]
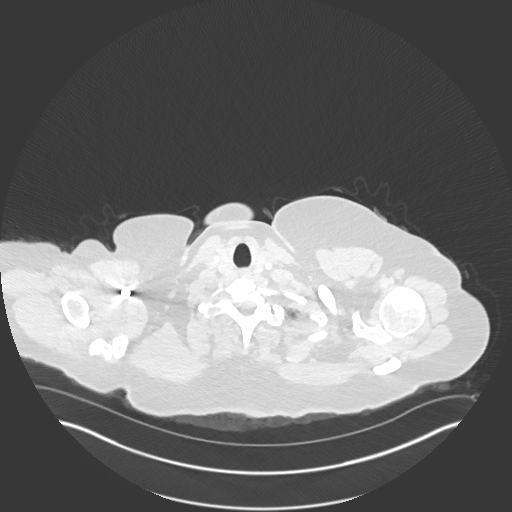

[Series 8: pe 2mm cor · coronal · 0.59mm/px · 1 of 151 slices shown]
[im 76/151  mediastinal]
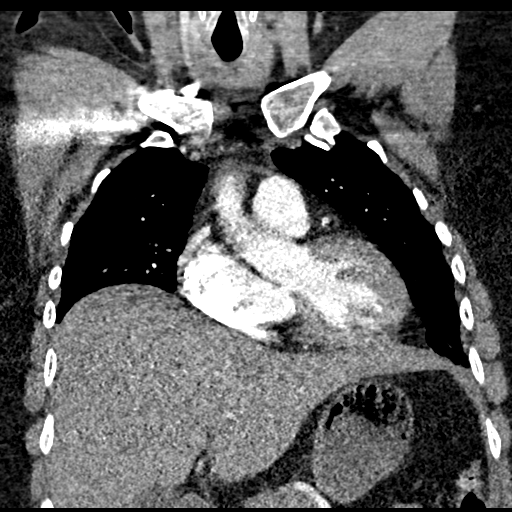

[18 of 36 positions shown; findings below may reference images not displayed]

FINDINGS: Cardiovascular: Satisfactory opacification of the pulmonary arteries
to segmental level. There is a central pulmonary embolus in the
posterior aspect of bilateral main pulmonary arteries. The thrombus
extends into all lobar branches, and is nearly occlusive in the left
lower and right lower lobar arterial branches. Extension of thrombus
is seen within segmental branches in bilateral lower lobes, lingula,
right middle lobe. Evidence of right heart strain with RV/LV ratio
of 1.2.

Mediastinum/Nodes: No enlarged mediastinal, hilar, or axillary lymph
nodes. Thyroid gland, and trachea demonstrate no significant
findings. Small hiatal hernia.

Lungs/Pleura: Small left pleural effusion. Atelectatic changes in
the left lower lobe and lingula. Mild mosaic attenuation of the lung
parenchyma, likely due to perfusion abnormalities.

Upper Abdomen: No acute abnormality.

Musculoskeletal: No chest wall abnormality. No acute or significant
osseous findings.

Review of the MIP images confirms the above findings.
IMPRESSION: Bilateral central pulmonary embolus extending to all lobar and most
of segmental branches of bilateral lungs. Near occlusive in
bilateral lower lobe pulmonary arterial branches. Evidence of right
heart strain with RV/LV ratio equals

Small left pleural effusion. Mild mosaic attenuation of the lung
parenchyma, likely due to perfusion abnormalities.

Critical Value/emergent results were called by telephone at the time
of interpretation on 08/01/2017 at [DATE] to Dr. Tivari , who verbally
acknowledged these results.

Small hiatal hernia.

## 2019-12-18 ENCOUNTER — Other Ambulatory Visit: Payer: Self-pay | Admitting: Nurse Practitioner

## 2019-12-18 ENCOUNTER — Telehealth: Payer: Self-pay | Admitting: Nurse Practitioner

## 2019-12-18 DIAGNOSIS — J45909 Unspecified asthma, uncomplicated: Secondary | ICD-10-CM

## 2019-12-18 DIAGNOSIS — U071 COVID-19: Secondary | ICD-10-CM

## 2019-12-18 DIAGNOSIS — Z86718 Personal history of other venous thrombosis and embolism: Secondary | ICD-10-CM

## 2019-12-18 NOTE — Telephone Encounter (Signed)
Called to discuss with Diane Shaffer about Covid symptoms and the use of casirivimab/imdevimab, a combination monoclonal antibody infusion for those with mild to moderate Covid symptoms and at a high risk of hospitalization.     Pt is qualified for this infusion at the [facility] infusion center due to co-morbid conditions (severe asthmatic). Symptom onset 12/16/19 with cough and congestion.   Patient verbalized understanding of infusion and appointment details.  Scheduled for  12/19/19 @ 0830.   Patient Active Problem List   Diagnosis Date Noted  . Leg cramps 02/08/2019  . 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 Closed left ankle fracture 08/01/2017  . Numbness and tingling 05/10/2014  . Physical exam, annual 11/30/2011  . Tremor of unknown origin 11/30/2011  . Cold intolerance 11/30/2011  . LACERATION 11/21/2008  . LEG CRAMPS, NOCTURNAL 04/19/2008  . DIZZINESS 04/19/2008  . PULMONARY EMBOLISM, HX OF 04/19/2008  . EMBOLISM/INFARCTION, PULMONARY NEC 05/11/2006    Willette Alma, AGPCNP-BC

## 2019-12-18 NOTE — Progress Notes (Signed)
I connected by phone with Diane Shaffer on 12/18/2019 at 11:18 AM to discuss the potential use of a new treatment for mild to moderate COVID-19 viral infection in non-hospitalized patients.  This patient is a 49 y.o. female that meets the FDA criteria for Emergency Use Authorization of COVID monoclonal antibody casirivimab/imdevimab.  Has a (+) direct SARS-CoV-2 viral test result  Has mild or moderate COVID-19   Is NOT hospitalized due to COVID-19  Is within 10 days of symptom onset  Has at least one of the high risk factor(s) for progression to severe COVID-19 and/or hospitalization as defined in EUA.  Specific high risk criteria : Chronic Lung Disease,, BMI> 25   I have spoken and communicated the following to the patient or parent/caregiver regarding COVID monoclonal antibody treatment:  1. FDA has authorized the emergency use for the treatment of mild to moderate COVID-19 in adults and pediatric patients with positive results of direct SARS-CoV-2 viral testing who are 7 years of age and older weighing at least 40 kg, and who are at high risk for progressing to severe COVID-19 and/or hospitalization.  2. The significant known and potential risks and benefits of COVID monoclonal antibody, and the extent to which such potential risks and benefits are unknown.  3. Information on available alternative treatments and the risks and benefits of those alternatives, including clinical trials.  4. Patients treated with COVID monoclonal antibody should continue to self-isolate and use infection control measures (e.g., wear mask, isolate, social distance, avoid sharing personal items, clean and disinfect "high touch" surfaces, and frequent handwashing) according to CDC guidelines.   5. The patient or parent/caregiver has the option to accept or refuse COVID monoclonal antibody treatment.  After reviewing this information with the patient, The patient agreed to proceed with receiving  casirivimab\imdevimab infusion and will be provided a copy of the Fact sheet prior to receiving the infusion. Jake Samples Pickenpack-Cousar 12/18/2019 11:18 AM

## 2019-12-19 ENCOUNTER — Ambulatory Visit (HOSPITAL_COMMUNITY)
Admission: RE | Admit: 2019-12-19 | Discharge: 2019-12-19 | Disposition: A | Discharge status: Home / Self Care | Payer: BC Managed Care – PPO | Source: Ambulatory Visit | Attending: Pulmonary Disease | Admitting: Pulmonary Disease

## 2019-12-19 DIAGNOSIS — Z86718 Personal history of other venous thrombosis and embolism: Secondary | ICD-10-CM | POA: Diagnosis present

## 2019-12-19 DIAGNOSIS — U071 COVID-19: Secondary | ICD-10-CM | POA: Insufficient documentation

## 2019-12-19 DIAGNOSIS — J45909 Unspecified asthma, uncomplicated: Secondary | ICD-10-CM | POA: Insufficient documentation

## 2019-12-19 MED ORDER — ALBUTEROL SULFATE HFA 108 (90 BASE) MCG/ACT IN AERS: 2.0000 | INHALATION_SPRAY | Freq: Once | RESPIRATORY_TRACT | Status: DC | PRN

## 2019-12-19 MED ORDER — FAMOTIDINE IN NACL 20-0.9 MG/50ML-% IV SOLN: 20.0000 mg | Freq: Once | INTRAVENOUS | Status: DC | PRN

## 2019-12-19 MED ORDER — SODIUM CHLORIDE 0.9 % IV SOLN
1200.0000 mg | Freq: Once | INTRAVENOUS | Status: AC
  Administered 2019-12-19: 1200 mg via INTRAVENOUS
  Filled 2019-12-19: qty 10

## 2019-12-19 MED ORDER — EPINEPHRINE 0.3 MG/0.3ML IJ SOAJ: 0.3000 mg | Freq: Once | INTRAMUSCULAR | Status: DC | PRN

## 2019-12-19 MED ORDER — METHYLPREDNISOLONE SODIUM SUCC 125 MG IJ SOLR: 125.0000 mg | Freq: Once | INTRAMUSCULAR | Status: DC | PRN

## 2019-12-19 MED ORDER — DIPHENHYDRAMINE HCL 50 MG/ML IJ SOLN: 50.0000 mg | Freq: Once | INTRAMUSCULAR | Status: DC | PRN

## 2019-12-19 MED ORDER — SODIUM CHLORIDE 0.9 % IV SOLN: INTRAVENOUS | Status: DC | PRN

## 2019-12-19 NOTE — Discharge Instructions (Signed)

## 2019-12-19 NOTE — Progress Notes (Signed)
  Diagnosis: COVID-19  Physician: Dr Patrick Wright  Procedure: [facility] Infusion [facility] Med: casirivimab\imdevimab infusion - Provided patient with casirivimab\imdevimab fact sheet for patients, parents and caregivers prior to infusion.  Complications: No immediate complications noted.  Discharge: Discharged home   Koa Zoeller 12/19/2019   

## 2020-01-04 ENCOUNTER — Other Ambulatory Visit: Payer: Self-pay | Admitting: Neurology

## 2020-05-20 ENCOUNTER — Other Ambulatory Visit: Payer: Self-pay | Admitting: Neurology

## 2021-01-26 ENCOUNTER — Encounter: Payer: Self-pay | Admitting: Allergy and Immunology

## 2021-01-26 ENCOUNTER — Other Ambulatory Visit: Payer: Self-pay

## 2021-01-26 ENCOUNTER — Ambulatory Visit: Payer: BC Managed Care – PPO | Admitting: Allergy and Immunology

## 2021-01-26 VITALS — BP 122/74 | HR 81 | Resp 15 | Ht 68.0 in | Wt 225.0 lb

## 2021-01-26 DIAGNOSIS — J3089 Other allergic rhinitis: Secondary | ICD-10-CM

## 2021-01-26 DIAGNOSIS — L500 Allergic urticaria: Secondary | ICD-10-CM | POA: Diagnosis not present

## 2021-01-26 DIAGNOSIS — J4540 Moderate persistent asthma, uncomplicated: Secondary | ICD-10-CM

## 2021-01-26 DIAGNOSIS — T783XXD Angioneurotic edema, subsequent encounter: Secondary | ICD-10-CM

## 2021-01-26 MED ORDER — FAMOTIDINE 20 MG PO TABS: 20.0000 mg | ORAL_TABLET | Freq: Two times a day (BID) | ORAL | 5 refills | Status: DC

## 2021-01-26 MED ORDER — CETIRIZINE HCL 10 MG PO TABS: ORAL_TABLET | ORAL | 5 refills | Status: AC

## 2021-01-26 MED ORDER — EPINEPHRINE 0.3 MG/0.3ML IJ SOAJ: 0.3000 mg | INTRAMUSCULAR | 1 refills | Status: AC | PRN

## 2021-01-26 NOTE — Progress Notes (Signed)
[place] - [place] - [place]   NEW PATIENT NOTE  Referring Provider: No ref. provider found Primary Provider: [facility] Date of office visit: 01/26/2021    Subjective:   Chief Complaint:  Diane Shaffer (DOB: [birth year]) is a 50 y.o. female who presents to the clinic on 01/26/2021 with a chief complaint of Allergic Reaction (Rash and lip swelling since August ), Eczema (Diagnosis this year ), and Cough (recurrent) .     HPI: [name] presents to this clinic in evaluation of hives.  She developed red raised itchy lesions across her body that never heal with scar or hyperpigmentation, and last less than a day, since the very beginning of August 2022.  She also develops intermittent lip swelling usually occurring a few times per week.  There is no associated systemic or constitutional symptoms.  She will take some Benadryl.  Concerning possible triggers for this immune activation manifested as urticaria, she has not really had any new environmental exposures, started any new supplements or over-the-counter medications, taking any new prescription medications, or have symptoms to suggest an ongoing infectious disease.  She apparently had some blood test performed last week in investigation of this issue at a dermatologist office.  The dermatologist sees her for hand eczema.  She has a history of asthma which is under excellent control while using a combination inhaler and she has not had problems with "bronchitis" in 3 years while using this plan and rarely uses a short acting bronchodilator.  She has some occasional nasal congestion and sneezing.  Sometimes the consumption of raw potato may make her mouth itch.  She has required systemic steroids mostly for her hand issue and also she had a joint injury that required her to use systemic steroids this year.  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  She  does not have any obstructive events at this point.  Past Medical History:  Diagnosis Date   Anxiety    Asthma    Depression    DVT (deep vein thrombosis) in pregnancy 08/2017   Nocturnal leg cramps    Palpitations    Pulmonary embolism (HCC) 2008   released by MD, result of birth control    Past Surgical History:  Procedure Laterality Date   ABDOMINAL HYSTERECTOMY  2005   ANKLE SURGERY Left 06/2017   ORIF   LAPAROSCOPIC ASSISTED VAGINAL HYSTERECTOMY  03/16/2012   Procedure: LAPAROSCOPIC ASSISTED VAGINAL HYSTERECTOMY;  Surgeon: Zelphia Cairo, MD;  Location: [facility];  Service: Gynecology;  Laterality: N/A;   SHOULDER SURGERY Right 12/2017   rotator cuff   TUBAL LIGATION      Allergies as of 01/26/2021       Reactions   Azithromycin Hives   Morphine Hives   Headache.        Medication List    acetaminophen 500 MG tablet Commonly known as: TYLENOL Take 1,000 mg by mouth every 6 (six) hours as needed.   albuterol 108 (90 Base) MCG/ACT inhaler Commonly known as: VENTOLIN HFA SMARTSIG:1-2 Puff(s) By Mouth Every 6-8 Hours PRN   clobetasol ointment 0.05 % Commonly known as: TEMOVATE SMARTSIG:Sparingly Topical Twice Daily   clobetasol cream 0.05 % Commonly known as: TEMOVATE Apply topically 2 (two) times daily.   Eucrisa 2 % Oint Generic drug: Crisaborole SMARTSIG:Sparingly Topical Every Night   fluconazole 200 MG tablet Commonly known as: DIFLUCAN Take 200 mg by mouth once a  week.   Fluticasone-Salmeterol 113-14 MCG/ACT Aepb Take 2 puffs by mouth daily.   hydrocortisone 2.5 % ointment Apply topically.   ketoconazole 2 % cream Commonly known as: NIZORAL SMARTSIG:Sparingly Topical Twice Daily   ketoconazole 2 % shampoo Commonly known as: NIZORAL Apply topically 2 (two) times a week.   montelukast 10 MG tablet Commonly known as: SINGULAIR Take 10 mg by mouth at bedtime.   pantoprazole 40 MG tablet Commonly known as: PROTONIX Take 40 mg by mouth  daily.   pregabalin 50 MG capsule Commonly known as: LYRICA Take 3 capsules (150 mg total) by mouth at bedtime. Please call [phone] to schedule appt or may also request from PCP.   rivaroxaban 20 MG Tabs tablet Commonly known as: XARELTO Take 20 mg by mouth daily with supper.   traZODone 50 MG tablet Commonly known as: DESYREL Take 50-100 mg by mouth at bedtime.   triamcinolone cream 0.1 % Commonly known as: KENALOG Apply topically 3 (three) times daily as needed.        Review of systems negative except as noted in HPI / PMHx or noted below:  Review of Systems  Constitutional: Negative.   HENT: Negative.    Eyes: Negative.   Respiratory: Negative.    Cardiovascular: Negative.   Gastrointestinal: Negative.   Genitourinary: Negative.   Musculoskeletal: Negative.   Skin: Negative.   Neurological: Negative.   Endo/Heme/Allergies: Negative.   Psychiatric/Behavioral: Negative.     Family History  Problem Relation Age of Onset   Asthma Mother    COPD Mother    Bronchitis Mother    Sinusitis Father    Kidney cancer Father    Asthma Sister    Thyroid disease Sister    Sinusitis Sister    Food Allergy Sister    Allergic rhinitis Maternal Aunt    Asthma Maternal Aunt    Diabetes Maternal Uncle    Allergic rhinitis Paternal Aunt    Heart disease Maternal Grandfather    Immunodeficiency Neg Hx    Urticaria Neg Hx    Angioedema Neg Hx     Social History   Socioeconomic History   Marital status: Married    Spouse name: Not on file   Number of children: 2   Years of education: 12   Highest education level: Not on file  Occupational History   Not on file  Tobacco Use   Smoking status: Never   Smokeless tobacco: Never  Vaping Use   Vaping Use: Never used  Substance and Sexual Activity   Alcohol use: Yes    Alcohol/week: 3.0 standard drinks    Types: 3 Glasses of wine per week    Comment: socially, 4-6/weekly   Drug use: No   Sexual activity: Not on file   Other Topics Concern   Not on file  Social History Narrative   Lives with husband and 2 children in a one story home.     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     Education: high school.   Caffeine- 3, 8 oz weekly   Environmental and Social history  Lives in a house with a dry environment, a dog located inside the household, carpet in the bedroom, plastic on the bed, no plastic on the pillow, no smoking ongoing with inside the household.  She works in an office setting as a Engineer, agricultural.  Objective:   Vitals:   01/26/21 1439  BP: 122/74  Pulse: 81  Resp: 15  SpO2: 98%   Height: 5\' 8"  (172.7 cm) Weight: 225 lb (102.1 kg)  Physical Exam Constitutional:      Appearance: She is not diaphoretic.  HENT:     Head: Normocephalic.     Right Ear: Tympanic membrane, ear canal and external ear normal.     Left Ear: Tympanic membrane, ear canal and external ear normal.     Nose: Nose normal. No mucosal edema or rhinorrhea.     Mouth/Throat:     Pharynx: Uvula midline. No oropharyngeal exudate.  Eyes:     Conjunctiva/sclera: Conjunctivae normal.  Neck:     Thyroid: No thyromegaly.     Trachea: Trachea normal. No tracheal tenderness or tracheal deviation.  Cardiovascular:     Rate and Rhythm: Normal rate and regular rhythm.     Heart sounds: Normal heart sounds, S1 normal and S2 normal. No murmur heard. Pulmonary:     Effort: No respiratory distress.     Breath sounds: Normal breath sounds. No stridor. No wheezing or rales.  Lymphadenopathy:     Head:     Right side of head: No tonsillar adenopathy.     Left side of head: No tonsillar adenopathy.     Cervical: No cervical adenopathy.  Skin:    Findings: Rash (Blanching urticarial lesions trunk and extremities) present. No erythema.     Nails: There is no clubbing.  Neurological:     Mental Status: She is alert.    Diagnostics: Allergy skin tests were performed.  She did not demonstrate any  hypersensitivity against a screening panel of aeroallergens or foods.  Spirometry was performed and demonstrated an FEV1 of 2.92 @ 95 % of predicted. FEV1/FVC = 0.74  Assessment and Plan:    1. Allergic urticaria   2. Angioedema, subsequent encounter   3. Asthma, moderate persistent, well-controlled   4. Perennial allergic rhinitis     1.  Allergen avoidance measures?  2.  Use a combination of the following to treat immune activation:  A. Cetirizine 10 mg - 1-2 tablets 2 times per day (MAX=40mg /day) B. Famotidine 20 mg - 1 tablet 2 times per day C. Montelukast 10 mg - 1 tablet 1 time per day  3. If needed:  A. Auvi-Q 0.3, benadryl, MD / ER evaluation for allergic reaction  4. Review results of blood tests. Further testing???  5. Continue Advair and Albuterol as previously prescribed   6. Return to clinic in 4 weeks or earlier if problem  Teigan appears to have some form of immune activation with unknown etiologic factor.  Concerning therapy, we will try her on an H1 and H2 receptor blocker along with a leukotrienes modifier as noted above.  Concerning further evaluation, I will await the results of her previous blood tests prior to performing any additional investigation.  Her other atopic disease can be easily managed with her current anti-inflammatory medications for her airway.  Trula Ore, MD Allergy / Immunology [facility]

## 2021-01-26 NOTE — Patient Instructions (Addendum)
  1.  Allergen avoidance measures?  2.  Use a combination of the following to treat immune activation:  A. Cetirizine 10 mg - 1-2 tablets 2 times per day (MAX=40mg /day) B. Famotidine 20 mg - 1 tablet 2 times per day C. Montelukast 10 mg - 1 tablet 1 time per day  3. If needed:  A. Auvi-Q 0.3, benadryl, MD / ER evaluation for allergic reaction  4. Review results of blood tests. Further testing???  5. Continue Advair and Albuterol as previously prescribed   6. Return to clinic in 4 weeks or earlier if problem

## 2021-01-27 ENCOUNTER — Encounter: Payer: Self-pay | Admitting: Allergy and Immunology

## 2021-01-29 ENCOUNTER — Ambulatory Visit: Payer: BC Managed Care – PPO | Admitting: Allergy and Immunology

## 2021-01-29 ENCOUNTER — Telehealth: Payer: Self-pay | Admitting: Allergy and Immunology

## 2021-01-29 DIAGNOSIS — T783XXD Angioneurotic edema, subsequent encounter: Secondary | ICD-10-CM

## 2021-01-29 DIAGNOSIS — L500 Allergic urticaria: Secondary | ICD-10-CM

## 2021-01-29 NOTE — Telephone Encounter (Addendum)
Called [facility] and she had a CBC W DIFF, CMP, UA in september. They are faxing those over. What additional labs would you like her to have?

## 2021-01-29 NOTE — Telephone Encounter (Signed)
Patient states Dr. Lucie Leather was going to request blood work results from her dermatologist and see if additional testing would be needed. She is having a fasting blood work appointment with her primary doctor tomorrow and is wanting to know if she can go ahead and get all her blood work that is needed done at this appointment.

## 2021-01-29 NOTE — Telephone Encounter (Signed)
Received results via fax- placed on Dr. Johney Maine desk.

## 2021-01-29 NOTE — Telephone Encounter (Signed)
Per Dr. Lucie Leather: TSH, FT4, Thyroid peroxidase antibody, alpha-gal panel. And I need to see the [facility] blood tests.     Labs have been ordered.

## 2021-01-29 NOTE — Telephone Encounter (Signed)
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

## 2021-02-23 ENCOUNTER — Telehealth: Payer: Self-pay

## 2021-02-23 ENCOUNTER — Other Ambulatory Visit: Payer: Self-pay

## 2021-02-23 ENCOUNTER — Encounter: Payer: Self-pay | Admitting: Allergy and Immunology

## 2021-02-23 ENCOUNTER — Ambulatory Visit: Payer: BC Managed Care – PPO | Admitting: Allergy and Immunology

## 2021-02-23 VITALS — BP 122/76 | HR 73 | Resp 16

## 2021-02-23 DIAGNOSIS — L501 Idiopathic urticaria: Secondary | ICD-10-CM | POA: Diagnosis not present

## 2021-02-23 MED ORDER — EPINEPHRINE 0.3 MG/0.3ML IJ SOAJ: 0.3000 mg | Freq: Once | INTRAMUSCULAR | 1 refills | Status: AC

## 2021-02-23 MED ORDER — OMALIZUMAB 150 MG/ML ~~LOC~~ SOSY
300.0000 mg | PREFILLED_SYRINGE | SUBCUTANEOUS | Status: AC
  Administered 2021-02-23 – 2021-06-15 (×5): 300 mg via SUBCUTANEOUS

## 2021-02-23 NOTE — Progress Notes (Signed)
Immunotherapy   Patient Details  Name: [name] MRN: [id number] Date of Birth: 30-May-1970  02/23/2021  Diane Shaffer started Xolair injections today. Patient received sample in office. Patient waited 30 minutes in office without any issues. Prescription for epi-pen has been sent to pharmacy.  Consent signed and patient instructions given.   Deborra Medina 02/23/2021, 6:35 PM

## 2021-02-23 NOTE — Progress Notes (Signed)
[place] - [place] - [place] - [place] - [place]   Follow-up Note  Referring Provider: [facility] Primary Provider: [facility] Date of Office Visit: 02/23/2021  Subjective:   Diane Shaffer (DOB: 24-Jan-1971) is a 50 y.o. female who [facility] in re-evaluation of the following:  HPI: Diane Shaffer returns to this clinic in evaluation of urticaria.  I last saw her in this clinic on 26 January 2021 which was her initial evaluation.  She continues to have widespread blanching red and erythematous lesions across her body that wax and wane throughout the day even in the face of using an H1 and H2 receptor blocker and a leukotriene modifier.  She is not any better at this point regarding this issue.  Allergies as of 02/23/2021       Reactions   Azithromycin Hives   Morphine Hives   Headache.        Medication List    acetaminophen 500 MG tablet Commonly known as: TYLENOL Take 1,000 mg by mouth every 6 (six) hours as needed.   albuterol 108 (90 Base) MCG/ACT inhaler Commonly known as: VENTOLIN HFA SMARTSIG:1-2 Puff(s) By Mouth Every 6-8 Hours PRN   cetirizine 10 MG tablet Commonly known as: ZYRTEC 1-2 tablets twice per day   clobetasol ointment 0.05 % Commonly known as: TEMOVATE SMARTSIG:Sparingly Topical Twice Daily   clobetasol cream 0.05 % Commonly known as: TEMOVATE Apply topically 2 (two) times daily.   EPINEPHrine 0.3 mg/0.3 mL Soaj injection Commonly known as: Auvi-Q Inject 0.3 mg into the muscle as needed for anaphylaxis.   Eucrisa 2 % Oint Generic drug: Crisaborole SMARTSIG:Sparingly Topical Every Night   famotidine 20 MG tablet Commonly known as: PEPCID Take 1 tablet (20 mg total) by mouth 2 (two) times daily.   fluconazole 200 MG tablet Commonly known as: DIFLUCAN Take 200 mg by mouth once a week.   Fluticasone-Salmeterol 113-14 MCG/ACT Aepb Take 2 puffs by mouth daily.    hydrocortisone 2.5 % ointment Apply topically.   ketoconazole 2 % cream Commonly known as: NIZORAL SMARTSIG:Sparingly Topical Twice Daily   ketoconazole 2 % shampoo Commonly known as: NIZORAL Apply topically 2 (two) times a week.   montelukast 10 MG tablet Commonly known as: SINGULAIR Take 10 mg by mouth at bedtime.   pantoprazole 40 MG tablet Commonly known as: PROTONIX Take 40 mg by mouth daily.   pregabalin 50 MG capsule Commonly known as: LYRICA Take 3 capsules (150 mg total) by mouth at bedtime. Please call [phone] to schedule appt or may also request from PCP.   rivaroxaban 20 MG Tabs tablet Commonly known as: XARELTO Take 20 mg by mouth daily with supper.   traZODone 50 MG tablet Commonly known as: DESYREL Take 50-100 mg by mouth at bedtime.   triamcinolone cream 0.1 % Commonly known as: KENALOG Apply topically 3 (three) times daily as needed.    Past Medical History:  Diagnosis Date   Anxiety    Asthma    Depression    DVT (deep vein thrombosis) in pregnancy 08/2017   Nocturnal leg cramps    Palpitations    Pulmonary embolism (HCC) 2008   released by MD, result of birth control    Past Surgical History:  Procedure Laterality Date   ABDOMINAL HYSTERECTOMY  2005   ANKLE SURGERY Left 06/2017   ORIF   LAPAROSCOPIC ASSISTED VAGINAL HYSTERECTOMY  03/16/2012   Procedure: LAPAROSCOPIC ASSISTED VAGINAL HYSTERECTOMY;  Surgeon: Vinnie Langton  Renaldo Fiddler, MD;  Location: [facility];  Service: Gynecology;  Laterality: N/A;   SHOULDER SURGERY Right 12/2017   rotator cuff   TUBAL LIGATION      Review of systems negative except as noted in HPI / PMHx or noted below:  Review of Systems  Constitutional: Negative.   HENT: Negative.    Eyes: Negative.   Respiratory: Negative.    Cardiovascular: Negative.   Gastrointestinal: Negative.   Genitourinary: Negative.   Musculoskeletal: Negative.   Skin: Negative.   Neurological: Negative.   Endo/Heme/Allergies: Negative.    Psychiatric/Behavioral: Negative.      Objective:   Vitals:   02/23/21 1816  BP: 122/76  Pulse: 73  Resp: 16  SpO2: 95%          Physical Exam Skin:    Findings: Rash (Widespread blanching erythematous urticarial lesions across trunk and arms.) present.    Diagnostics:    Results of blood tests obtained 30 January 2021 identified WBC 6.2, absolute eosinophil 400, absolute basophil 0, absolute lymphocyte 2200, hemoglobin 14.6, creatinine 0.97 mg/DL, AST 16 U/L, ALT 13 U/L, TSH 1.180 IU/mL, free T4 1.07 NG/DL, negative alpha gal panel with IgE 110 KU/L  Assessment and Plan:   1. Idiopathic urticaria    1.  Omalizumab 300 mg injection delivered in clinic and every 4 weeks.  2.  Use a combination of the following to treat immune activation:  A. Cetirizine 10 mg - 1-2 tablets 2 times per day (MAX=40mg /day) B. Famotidine 20 mg - 1 tablet 2 times per day C. Montelukast 10 mg - 1 tablet 1 time per day  3. If needed:  A. Auvi-Q 0.3, benadryl, MD / ER evaluation for allergic reaction  4.  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  I will see her back in this clinic in 12 weeks or earlier if there is a problem.  [name], MD Allergy / Immunology [facility]

## 2021-02-23 NOTE — Telephone Encounter (Signed)
Dr. Lucie Leather started patient on Xolair today for idiopathic urticaria. Sample was provided and consent was signed.

## 2021-02-23 NOTE — Patient Instructions (Signed)
  1.  Omalizumab 300 mg injection delivered in clinic and every 4 weeks.  2.  Use a combination of the following to treat immune activation:  A. Cetirizine 10 mg - 1-2 tablets 2 times per day (MAX=40mg /day) B. Famotidine 20 mg - 1 tablet 2 times per day C. Montelukast 10 mg - 1 tablet 1 time per day  3. If needed:  A. Auvi-Q 0.3, benadryl, MD / ER evaluation for allergic reaction  4.  Return to clinic in 12 weeks or earlier if problem

## 2021-02-24 ENCOUNTER — Encounter: Payer: Self-pay | Admitting: Allergy and Immunology

## 2021-02-27 ENCOUNTER — Encounter: Payer: Self-pay | Admitting: *Deleted

## 2021-03-02 NOTE — Telephone Encounter (Signed)
Called patient and advised approval, copay card and submit to [facility] for Xolair. Did discuss that she could possibly do self admin after 3 in office admins

## 2021-03-23 ENCOUNTER — Other Ambulatory Visit: Payer: Self-pay

## 2021-03-23 ENCOUNTER — Ambulatory Visit (INDEPENDENT_AMBULATORY_CARE_PROVIDER_SITE_OTHER): Payer: BC Managed Care – PPO

## 2021-03-23 DIAGNOSIS — L501 Idiopathic urticaria: Secondary | ICD-10-CM

## 2021-04-20 ENCOUNTER — Other Ambulatory Visit: Payer: Self-pay

## 2021-04-20 ENCOUNTER — Ambulatory Visit (INDEPENDENT_AMBULATORY_CARE_PROVIDER_SITE_OTHER): Payer: BC Managed Care – PPO | Admitting: *Deleted

## 2021-04-20 DIAGNOSIS — L501 Idiopathic urticaria: Secondary | ICD-10-CM | POA: Diagnosis not present

## 2021-04-28 ENCOUNTER — Telehealth: Payer: Self-pay

## 2021-04-28 NOTE — Telephone Encounter (Signed)
She should max out her cetirizine up to 40 mg a day.  As well, she should think about a possible trigger.  When she eating any unusual foods since Thursday through the holiday?  If she is getting better and we do not really need to do too much else but certainly if she goes the other direction we need to come up with a different plan.

## 2021-04-28 NOTE — Telephone Encounter (Signed)
Patient was informed and verbalized understanding. She states she did not eat any unusual foods and was not able to pin point a possible triggers. Patient will give [place] a call if she has any more issues.

## 2021-04-28 NOTE — Telephone Encounter (Signed)
Patient called and states she had to use her epi pen this past weekend. She states her hives got worse on Friday and Saturday she woke up with swollen lips. By the end of the day there lips were more swollen and very painful. She decided to use the epi pen since the Benadryl was not helping.  She is feeling better now. She does still have hives but no as bad.   She just wanted to report this to you and know if there was anything else she needed to do at the moment. Please advice

## 2021-05-14 ENCOUNTER — Telehealth: Payer: Self-pay | Admitting: *Deleted

## 2021-05-14 ENCOUNTER — Other Ambulatory Visit: Payer: Self-pay | Admitting: *Deleted

## 2021-05-14 MED ORDER — PREDNISONE 10 MG PO TABS: ORAL_TABLET | ORAL | 0 refills | Status: DC

## 2021-05-14 NOTE — Telephone Encounter (Signed)
Diane Shaffer calls stating that she has had a hive breakout for the last 4 days and had lip swelling on Monday. She is taking Zyrtec 20mg  twice daily, Famotidine 20 mg twice daily, and Montelukast 10mg  daily. Please advise.

## 2021-05-14 NOTE — Telephone Encounter (Signed)
No obvious triggers. She had a flare up at the end of December which was after her Xolair injections. I will send in the Prednisone.

## 2021-05-18 ENCOUNTER — Ambulatory Visit (INDEPENDENT_AMBULATORY_CARE_PROVIDER_SITE_OTHER): Payer: BC Managed Care – PPO | Admitting: *Deleted

## 2021-05-18 ENCOUNTER — Other Ambulatory Visit: Payer: Self-pay

## 2021-05-18 DIAGNOSIS — L501 Idiopathic urticaria: Secondary | ICD-10-CM | POA: Diagnosis not present

## 2021-06-15 ENCOUNTER — Other Ambulatory Visit: Payer: Self-pay

## 2021-06-15 ENCOUNTER — Ambulatory Visit (INDEPENDENT_AMBULATORY_CARE_PROVIDER_SITE_OTHER): Payer: BC Managed Care – PPO | Admitting: *Deleted

## 2021-06-15 DIAGNOSIS — L501 Idiopathic urticaria: Secondary | ICD-10-CM

## 2021-07-02 ENCOUNTER — Other Ambulatory Visit: Payer: Self-pay

## 2021-07-02 ENCOUNTER — Ambulatory Visit: Payer: BC Managed Care – PPO | Admitting: Allergy and Immunology

## 2021-07-02 ENCOUNTER — Encounter: Payer: Self-pay | Admitting: Allergy and Immunology

## 2021-07-02 VITALS — BP 116/80 | HR 76 | Resp 14 | Ht 68.0 in | Wt 219.2 lb

## 2021-07-02 DIAGNOSIS — T783XXD Angioneurotic edema, subsequent encounter: Secondary | ICD-10-CM

## 2021-07-02 DIAGNOSIS — T7840XD Allergy, unspecified, subsequent encounter: Secondary | ICD-10-CM

## 2021-07-02 DIAGNOSIS — L501 Idiopathic urticaria: Secondary | ICD-10-CM

## 2021-07-02 NOTE — Patient Instructions (Addendum)
?  1.  Discontinue omalizumab injections ? ?2.  Obtain complete eye exam in anticipation of starting hydroxychloroquine ? ?3.  After eye exam start hydroxychloroquine 200 mg twice a day (initially 1 time a day for 2 weeks) ? ?4.  Can continue to use a combination of the following to treat immune activation: ? ?A. Cetirizine 10 mg - 1-2 tablets 2 times per day (MAX=40mg /day) ?B. Famotidine 20 mg - 1 tablet 2 times per day ?C. Montelukast 10 mg - 1 tablet 1 time per day ?D. Auvi-q / epi-pen if needed ? ?5. Blood - ANA w/R, ANCA w/R, hep C screen, HIV screen,  ?  ?6. Further evaluation and treatment??? Return in 6 months or earlier if problem ?

## 2021-07-02 NOTE — Progress Notes (Signed)
? ?[place] [facility] [employer] [facility] [place] - [place] - [place] ? ? ?Follow-up Note ? ?Referring Provider: [facility] ?Primary Provider: [facility] ?Date of Office Visit: 07/02/2021 ? ?Subjective:  ? ?Diane Shaffer (DOB: Jun 20, 1970) is a 51 y.o. female who [facility] in re-evaluation of the following: ? ?HPI: [name] returns to this clinic in evaluation of her urticaria.  Her last visit to this clinic was 23 February 2021. ? ?We started her on omalizumab injections during her last visit.  She has not noticed any improvement while utilizing this plan of action.  She continues to have widespread red itchy lesions across her body that do not heal with scar hyperpigmentation without any associated systemic or constitutional symptoms and without any obvious trigger. ? ?Allergies as of 07/02/2021   ? ?   Reactions  ? Azithromycin Hives  ? Morphine Hives  ? Headache.  ? ?  ? ?  ?Medication List  ? ? ?acetaminophen 500 MG tablet ?Commonly known as: TYLENOL ?Take 1,000 mg by mouth every 6 (six) hours as needed. ?  ?albuterol 108 (90 Base) MCG/ACT inhaler ?Commonly known as: VENTOLIN HFA ?SMARTSIG:1-2 Puff(s) By Mouth Every 6-8 Hours PRN ?  ?cetirizine 10 MG tablet ?Commonly known as: ZYRTEC ?1-2 tablets twice per day ?  ?clobetasol ointment 0.05 % ?Commonly known as: TEMOVATE ?SMARTSIG:Sparingly Topical Twice Daily ?  ?clobetasol cream 0.05 % ?Commonly known as: TEMOVATE ?Apply topically 2 (two) times daily. ?  ?EPINEPHrine 0.3 mg/0.3 mL Soaj injection ?Commonly known as: Auvi-Q ?Inject 0.3 mg into the muscle as needed for anaphylaxis. ?  ?Eucrisa 2 % Oint ?Generic drug: Crisaborole ?SMARTSIG:Sparingly Topical Every Night ?  ?famotidine 20 MG tablet ?Commonly known as: PEPCID ?Take 1 tablet (20 mg total) by mouth 2 (two) times daily. ?  ?hydrocortisone 2.5 % ointment ?Apply topically. ?  ?ketoconazole 2 % cream ?Commonly known as: NIZORAL ?SMARTSIG:Sparingly Topical Twice  Daily ?  ?ketoconazole 2 % shampoo ?Commonly known as: NIZORAL ?Apply topically 2 (two) times a week. ?  ?montelukast 10 MG tablet ?Commonly known as: SINGULAIR ?Take 10 mg by mouth at bedtime. ?  ?pantoprazole 40 MG tablet ?Commonly known as: PROTONIX ?Take 40 mg by mouth daily. ?  ?pregabalin 50 MG capsule ?Commonly known as: LYRICA ?Take 3 capsules (150 mg total) by mouth at bedtime. Please call [phone] to schedule appt or may also request from PCP. ?  ?rivaroxaban 20 MG Tabs tablet ?Commonly known as: XARELTO ?Take 20 mg by mouth daily with supper. ?  ?triamcinolone cream 0.1 % ?Commonly known as: KENALOG ?Apply topically 3 (three) times daily as needed. ?  ? ? ?Past Medical History:  ?Diagnosis Date  ? Anxiety   ? Asthma   ? Depression   ? DVT (deep vein thrombosis) in pregnancy 08/2017  ? Nocturnal leg cramps   ? Palpitations   ? Pulmonary embolism (HCC) 2008  ? released by MD, result of birth control  ? ? ?Past Surgical History:  ?Procedure Laterality Date  ? ABDOMINAL HYSTERECTOMY  2005  ? ANKLE SURGERY Left 06/2017  ? ORIF  ? LAPAROSCOPIC ASSISTED VAGINAL HYSTERECTOMY  03/16/2012  ? Procedure: LAPAROSCOPIC ASSISTED VAGINAL HYSTERECTOMY;  Surgeon: Zelphia Cairo, MD;  Location: [facility];  Service: Gynecology;  Laterality: N/A;  ? SHOULDER SURGERY Right 12/2017  ? rotator cuff  ? TUBAL LIGATION    ? ? ?Review of systems negative except as noted in HPI / PMHx or noted below: ? ?  Review of Systems  ?Constitutional: Negative.   ?HENT: Negative.    ?Eyes: Negative.   ?Respiratory: Negative.    ?Cardiovascular: Negative.   ?Gastrointestinal: Negative.   ?Genitourinary: Negative.   ?Musculoskeletal: Negative.   ?Skin: Negative.   ?Neurological: Negative.   ?Endo/Heme/Allergies: Negative.   ?Psychiatric/Behavioral: Negative.    ? ? ?Objective:  ? ?Vitals:  ? 07/02/21 0818  ?BP: 116/80  ?Pulse: 76  ?Resp: 14  ?SpO2: 96%  ? ?Height: 5\' 8"  (172.7 cm)  ?Weight: 219 lb 3.2 oz (99.4 kg)  ? ?Physical Exam ?Skin: ?    Findings: Rash (Widespread erythematous urticarial lesions across entire body that blanch with pressure.) present.  ? ? ?Diagnostics: none ? ?Assessment and Plan:  ? ?1. Idiopathic urticaria   ?2. Angioedema, subsequent encounter   ?3. Allergic reaction, subsequent encounter   ? ? ?1.  Discontinue omalizumab injections ? ?2.  Obtain complete eye exam in anticipation of starting hydroxychloroquine ? ?3.  After eye exam start hydroxychloroquine 200 mg twice a day (initially 1 time a day for 2 weeks) ? ?4.  Can continue to use a combination of the following to treat immune activation: ? ?A. Cetirizine 10 mg - 1-2 tablets 2 times per day (MAX=40mg /day) ?B. Famotidine 20 mg - 1 tablet 2 times per day ?C. Montelukast 10 mg - 1 tablet 1 time per day ?D. Auvi-q / epi-pen if needed ? ?5. Blood - ANA w/R, ANCA w/R, hep C screen, HIV screen,  ?  ?6. Further evaluation and treatment??? Return in 6 months or earlier if problem ? ?Jaylenne's immune activation is not responding adequately to aggressive therapy including the use of omalizumab and now we will move onto hydroxychloroquine administration and also screen her blood for possible systemic disease contributing to her immune activation.  We will start her on hydroxychloroquine when she has a full eye exam documenting absence of any retinopathy. ? ? , MD ?Allergy / Immunology ?[facility] ?

## 2021-07-06 ENCOUNTER — Encounter: Payer: Self-pay | Admitting: Allergy and Immunology

## 2021-07-09 ENCOUNTER — Other Ambulatory Visit: Payer: Self-pay

## 2021-07-09 MED ORDER — HYDROXYCHLOROQUINE SULFATE 200 MG PO TABS: ORAL_TABLET | ORAL | 5 refills | Status: AC

## 2021-07-13 ENCOUNTER — Ambulatory Visit: Payer: BC Managed Care – PPO

## 2021-07-21 ENCOUNTER — Encounter: Payer: Self-pay | Admitting: Allergy and Immunology

## 2021-08-10 ENCOUNTER — Other Ambulatory Visit: Payer: Self-pay | Admitting: Allergy and Immunology

## 2021-12-08 ENCOUNTER — Other Ambulatory Visit: Payer: Self-pay | Admitting: Obstetrics and Gynecology

## 2021-12-08 DIAGNOSIS — R928 Other abnormal and inconclusive findings on diagnostic imaging of breast: Secondary | ICD-10-CM

## 2021-12-24 ENCOUNTER — Ambulatory Visit
Admission: RE | Admit: 2021-12-24 | Discharge: 2021-12-24 | Disposition: A | Discharge status: Home / Self Care | Payer: BC Managed Care – PPO | Source: Ambulatory Visit | Attending: Obstetrics and Gynecology | Admitting: Obstetrics and Gynecology

## 2021-12-24 DIAGNOSIS — R928 Other abnormal and inconclusive findings on diagnostic imaging of breast: Secondary | ICD-10-CM

## 2022-11-30 ENCOUNTER — Other Ambulatory Visit: Payer: Self-pay | Admitting: Internal Medicine

## 2022-11-30 DIAGNOSIS — M79661 Pain in right lower leg: Secondary | ICD-10-CM

## 2022-12-02 ENCOUNTER — Other Ambulatory Visit: Payer: BC Managed Care – PPO
